# Patient Record
Sex: Female | Born: 1945
Health system: Southern US, Community
[De-identification: ages and names within clinical notes are randomized; demographics above are authoritative.]

## PROBLEM LIST (undated history)

## (undated) DIAGNOSIS — Z9989 Dependence on other enabling machines and devices: Secondary | ICD-10-CM

## (undated) DIAGNOSIS — K219 Gastro-esophageal reflux disease without esophagitis: Secondary | ICD-10-CM

## (undated) DIAGNOSIS — E119 Type 2 diabetes mellitus without complications: Secondary | ICD-10-CM

## (undated) DIAGNOSIS — D069 Carcinoma in situ of cervix, unspecified: Secondary | ICD-10-CM

## (undated) DIAGNOSIS — Z789 Other specified health status: Secondary | ICD-10-CM

## (undated) DIAGNOSIS — F319 Bipolar disorder, unspecified: Secondary | ICD-10-CM

## (undated) DIAGNOSIS — M1990 Unspecified osteoarthritis, unspecified site: Secondary | ICD-10-CM

## (undated) DIAGNOSIS — I10 Essential (primary) hypertension: Secondary | ICD-10-CM

## (undated) DIAGNOSIS — Z973 Presence of spectacles and contact lenses: Secondary | ICD-10-CM

## (undated) DIAGNOSIS — G4733 Obstructive sleep apnea (adult) (pediatric): Secondary | ICD-10-CM

## (undated) DIAGNOSIS — E785 Hyperlipidemia, unspecified: Secondary | ICD-10-CM

## (undated) HISTORY — PX: SUPRACERVICAL ABDOMINAL HYSTERECTOMY: SHX5393

## (undated) HISTORY — DX: Essential (primary) hypertension: I10

## (undated) HISTORY — PX: TUBAL LIGATION: SHX77

---

## 2002-08-14 ENCOUNTER — Inpatient Hospital Stay (HOSPITAL_COMMUNITY): Admission: EM | Admit: 2002-08-14 | Discharge: 2002-08-20 | Payer: Self-pay | Admitting: Psychiatry

## 2007-07-05 ENCOUNTER — Inpatient Hospital Stay (HOSPITAL_COMMUNITY): Admission: AD | Admit: 2007-07-05 | Discharge: 2007-07-08 | Payer: Self-pay | Admitting: Psychiatry

## 2007-07-05 ENCOUNTER — Ambulatory Visit: Payer: Self-pay | Admitting: Psychiatry

## 2011-01-15 NOTE — H&P (Signed)
[name], [name]                  ACCOUNT NO.:  [id number]   MEDICAL RECORD NO.:  [id number]          PATIENT TYPE:  IPS   LOCATION:  0406                          FACILITY:  [facility]   PHYSICIAN:  Anselm Jungling, MD  DATE OF BIRTH:  11-Jul-1946   DATE OF ADMISSION:  07/05/2007  DATE OF DISCHARGE:                       PSYCHIATRIC ADMISSION ASSESSMENT   IDENTIFYING INFORMATION:  This is a 65 year old divorced white female  who presented to the ED at [facility] reporting that she was stressed and  not sleeping for the past week.  The commitment papers indicate that she  was quite manic, she had been destructive to her home and could not  function.  Interestingly enough, her review of records shows that she  was started on prednisone 20 mg daily on October 24 and she has not  slept since then.  Today she can not tell us why the prednisone was  prescribed, but we will be stopping it.  On admission, the patient's  daughter stated that she felt that her mother had gotten distressed at a  birthday party where her mother-in-law started inferring that the  patient's son need not have married this daughter.   PAST PSYCHIATRIC HISTORY:  Ms. Melissa [name] was last with [place] in February 2003.  According to that discharge summary she had had one other remote  hospitalization, dates were unknown, and according to this admission she  has also been to [facility], but again there is no  date.  She is followed at [facility] by Dr. Cheree Ditto.   SOCIAL HISTORY:  She went to the 9th grade.  She has been married 4  times. She has 3 children.  Her oldest son Melissa [name], age 42, also has  bipolar.  She has another son 67 and a daughter 40.  The patient is not  employed and she does receive disability for her nerves.  Alcohol and  drug history:  Her father and brother both are alcoholics.  Primary care  [name]:  She is followed by Dr. Harrison Mons.  She is known to have diabetes,  hypertension and  hypercholesteremia.   MEDICATIONS:  She is currently prescribed:  1. Prozac 20 mg p.o. daily.  2. Metformin ER 500 mg p.o. daily.  3. Aspirin 325 mg p.o. daily.  4. Hydrochlorothiazide 25 mg p.o. daily.  5. Niacin ER 500 mg p.o. daily.  6. Lipitor 10 mg p.o. daily.  7. She is prescribed prednisone 20, but we are stopping that.   DRUG ALLERGIES:  SULFA.   POSITIVE PHYSICAL FINDINGS:  She was medically cleared in the ED at  [facility].  Her UDS was negative.  She had no alcohol on board.  She had  no other remarkable physical findings.  She was evaluated for atypical  chest pain and had no cardiac findings.  VITAL SIGNS ON ADMISSION TO OUR UNIT:  Show her pulse was 99,  respirations 16, blood pressure 155/89.  MENTAL STATUS EXAM:  She is quite drowsy.  She was given some Ativan on  admission and she is sleeping, she was awakened for the  exam.  She is  casually groomed and dressed.  She appears to be adequately nourished.  Her speech is decreased, although on admission she was tangential and  hyper.  Her mood at the moment is calm, her affect is sleepy.  She keeps  nodding off.  Thought processes:  Were not able to effectively evaluate  due to her being drowsy, but on admission her thought processes were  tangential and she had flight of ideas.  Judgment and insight are poor.  Concentration and memory were decreased and intelligence is average.  She denies being suicidal or homicidal.  She denies auditory or visual  hallucinations.   She was felt to be having an adjustment reaction with atypical chest  pain at [facility].  She does have a history for bipolar disorder.  Axis II:  Deferred, although with 4 husbands there is probably some  element of personality disorder.  Axis III:  Diabetes, hypertension and  hypercholesterolemia.  Axis IV:  Problems with primary support group.  Axis V:  20.   The plan is to admit for safety and stabilization.  We will adjust her  meds as indicated.   Toward that end, the prednisone was held, and  estimated length of stay is 3-4 days at the most.      [facility], [facility].      Anselm Jungling, MD  Electronically Signed    MD/MEDQ  D:  07/05/2007  T:  07/06/2007  Job:  [id number]
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
## 2011-01-18 NOTE — Discharge Summary (Signed)
NAMERONNESHA, MESTER                  ACCOUNT NO.:  [id number]   MEDICAL RECORD NO.:  [id number]          PATIENT TYPE:  IPS   LOCATION:  0406                          FACILITY:  [facility]   PHYSICIAN:  Anselm Jungling, MD  DATE OF BIRTH:  [birth year]   DATE OF ADMISSION:  07/05/2007  DATE OF DISCHARGE:  07/08/2007                               DISCHARGE SUMMARY   IDENTIFYING DATA AND REASON FOR ADMISSION:  This was an inpatient  psychiatric admission for Melissa [name], a 65 year old female who was admitted  at the to our inpatient facility after presenting at the emergency  department at [facility], stressed and not sleeping for a week.  That commitment papers indicated behavior and thinking consistent with a  manic episode.  She had been destructive to her home and was behaving  bizarrely.  Please refer to the admission note for further details  pertaining to the symptoms, circumstances and history that led to her  hospitalization.  She was given an initial Axis I diagnosis of psychosis  NOS.   MEDICAL AND LABORATORY:  The patient was medically and physically  assessed at [facility] emergency department, and then by our physician's  assistant upon arrival.  She came to [place] with a history of hypertension,  and diabetes mellitus.  She was continued on a regimen of  hydrochlorothiazide, Lipitor, Glucophage, and aspirin.  There were no  significant medical issues during this stay.   HOSPITAL COURSE:  The patient was admitted to the adult inpatient  psychiatric service.  She presented as a well-nourished, well-developed  woman who was generally pleasant, cheerful, inappropriately so, with  flight of ideas and tangential thinking.  She was generally cooperative.   She was treated with a regimen of Prozac 20 mg daily which she had been  taking previously.  Her physician, Dr. Cheree Ditto, was contacted by the  undersigned.  He indicated that she had severe episodes of psychosis in  the past.  He  suggested that we start her on Trilafon 2 mg q.h.s.  This  was initiated, and well tolerated.   The patient gave [place] permission to contact her adult children, and the  case manager did so.  The patient recompensated fairly rapidly, and she  agreed to continue taking Trilafon.  She liked the fact that it allowed  her to sleep better.  She agreed to the following aftercare plan.   AFTERCARE:  The patient was to follow up with an appointment at [facility] on July 09, 2007.   DISCHARGE MEDICATIONS:  Prozac 20 mg daily, Trilafon 2 mg q.h.s.,  hydrochlorothiazide 25 mg daily, niacin 1000 mg q.h.s., Lipitor 10 mg  q.p.m., Glucophage 500 mg daily, and aspirin 325 mg daily.  The patient  was instructed to follow-up with Dr. Harrison Mons, her medical physician, and  Dr. Cheree Ditto as scheduled.   DISCHARGE DIAGNOSES:  AXIS I: Bipolar disorder, most recently manic with  psychotic features, resolving.  AXIS II: Deferred.  AXIS III: History of hypertension, diabetes mellitus.  AXIS IV: Stressors severe.  AXIS V: GAF on discharge 55.  Anselm Jungling, MD  Electronically Signed     SPB/MEDQ  D:  07/10/2007  T:  07/12/2007  Job:  [id number]

## 2011-06-11 LAB — TSH: TSH: 1.761

## 2011-10-28 DIAGNOSIS — E119 Type 2 diabetes mellitus without complications: Secondary | ICD-10-CM | POA: Diagnosis not present

## 2011-10-28 DIAGNOSIS — E7800 Pure hypercholesterolemia, unspecified: Secondary | ICD-10-CM | POA: Diagnosis not present

## 2011-10-28 DIAGNOSIS — K219 Gastro-esophageal reflux disease without esophagitis: Secondary | ICD-10-CM | POA: Diagnosis not present

## 2011-10-28 DIAGNOSIS — I10 Essential (primary) hypertension: Secondary | ICD-10-CM | POA: Diagnosis not present

## 2011-12-19 DIAGNOSIS — H2589 Other age-related cataract: Secondary | ICD-10-CM | POA: Diagnosis not present

## 2011-12-19 DIAGNOSIS — E119 Type 2 diabetes mellitus without complications: Secondary | ICD-10-CM | POA: Diagnosis not present

## 2012-02-03 DIAGNOSIS — K219 Gastro-esophageal reflux disease without esophagitis: Secondary | ICD-10-CM | POA: Diagnosis not present

## 2012-02-03 DIAGNOSIS — I10 Essential (primary) hypertension: Secondary | ICD-10-CM | POA: Diagnosis not present

## 2012-02-03 DIAGNOSIS — E119 Type 2 diabetes mellitus without complications: Secondary | ICD-10-CM | POA: Diagnosis not present

## 2012-02-03 DIAGNOSIS — E7800 Pure hypercholesterolemia, unspecified: Secondary | ICD-10-CM | POA: Diagnosis not present

## 2012-02-10 DIAGNOSIS — I10 Essential (primary) hypertension: Secondary | ICD-10-CM | POA: Diagnosis not present

## 2012-03-31 DIAGNOSIS — F3189 Other bipolar disorder: Secondary | ICD-10-CM | POA: Diagnosis not present

## 2012-05-06 DIAGNOSIS — Z1231 Encounter for screening mammogram for malignant neoplasm of breast: Secondary | ICD-10-CM | POA: Diagnosis not present

## 2012-05-12 DIAGNOSIS — E7800 Pure hypercholesterolemia, unspecified: Secondary | ICD-10-CM | POA: Diagnosis not present

## 2012-05-12 DIAGNOSIS — R197 Diarrhea, unspecified: Secondary | ICD-10-CM | POA: Diagnosis not present

## 2012-05-12 DIAGNOSIS — E785 Hyperlipidemia, unspecified: Secondary | ICD-10-CM | POA: Diagnosis not present

## 2012-05-12 DIAGNOSIS — J40 Bronchitis, not specified as acute or chronic: Secondary | ICD-10-CM | POA: Diagnosis not present

## 2012-05-12 DIAGNOSIS — E119 Type 2 diabetes mellitus without complications: Secondary | ICD-10-CM | POA: Diagnosis not present

## 2012-05-28 DIAGNOSIS — I10 Essential (primary) hypertension: Secondary | ICD-10-CM | POA: Diagnosis not present

## 2012-05-28 DIAGNOSIS — Z23 Encounter for immunization: Secondary | ICD-10-CM | POA: Diagnosis not present

## 2012-05-28 DIAGNOSIS — E119 Type 2 diabetes mellitus without complications: Secondary | ICD-10-CM | POA: Diagnosis not present

## 2012-06-24 DIAGNOSIS — R0602 Shortness of breath: Secondary | ICD-10-CM | POA: Diagnosis not present

## 2012-06-24 DIAGNOSIS — K219 Gastro-esophageal reflux disease without esophagitis: Secondary | ICD-10-CM | POA: Diagnosis not present

## 2012-07-01 DIAGNOSIS — J309 Allergic rhinitis, unspecified: Secondary | ICD-10-CM | POA: Diagnosis not present

## 2012-07-01 DIAGNOSIS — I10 Essential (primary) hypertension: Secondary | ICD-10-CM | POA: Diagnosis not present

## 2012-07-01 DIAGNOSIS — E119 Type 2 diabetes mellitus without complications: Secondary | ICD-10-CM | POA: Diagnosis not present

## 2012-08-07 DIAGNOSIS — F3189 Other bipolar disorder: Secondary | ICD-10-CM | POA: Diagnosis not present

## 2012-08-18 DIAGNOSIS — E785 Hyperlipidemia, unspecified: Secondary | ICD-10-CM | POA: Diagnosis not present

## 2012-08-18 DIAGNOSIS — E119 Type 2 diabetes mellitus without complications: Secondary | ICD-10-CM | POA: Diagnosis not present

## 2012-08-18 DIAGNOSIS — I10 Essential (primary) hypertension: Secondary | ICD-10-CM | POA: Diagnosis not present

## 2012-08-18 DIAGNOSIS — R197 Diarrhea, unspecified: Secondary | ICD-10-CM | POA: Diagnosis not present

## 2012-09-24 DIAGNOSIS — M542 Cervicalgia: Secondary | ICD-10-CM | POA: Diagnosis not present

## 2012-09-24 DIAGNOSIS — K219 Gastro-esophageal reflux disease without esophagitis: Secondary | ICD-10-CM | POA: Diagnosis not present

## 2012-09-29 DIAGNOSIS — J00 Acute nasopharyngitis [common cold]: Secondary | ICD-10-CM | POA: Diagnosis not present

## 2012-10-02 DIAGNOSIS — E119 Type 2 diabetes mellitus without complications: Secondary | ICD-10-CM | POA: Diagnosis not present

## 2012-10-02 DIAGNOSIS — F329 Major depressive disorder, single episode, unspecified: Secondary | ICD-10-CM | POA: Diagnosis not present

## 2012-10-02 DIAGNOSIS — I10 Essential (primary) hypertension: Secondary | ICD-10-CM | POA: Diagnosis not present

## 2012-10-06 DIAGNOSIS — I10 Essential (primary) hypertension: Secondary | ICD-10-CM | POA: Diagnosis not present

## 2012-10-06 DIAGNOSIS — J209 Acute bronchitis, unspecified: Secondary | ICD-10-CM | POA: Diagnosis not present

## 2012-10-06 DIAGNOSIS — J00 Acute nasopharyngitis [common cold]: Secondary | ICD-10-CM | POA: Diagnosis not present

## 2012-10-06 DIAGNOSIS — F329 Major depressive disorder, single episode, unspecified: Secondary | ICD-10-CM | POA: Diagnosis not present

## 2012-10-06 DIAGNOSIS — E119 Type 2 diabetes mellitus without complications: Secondary | ICD-10-CM | POA: Diagnosis not present

## 2012-10-08 DIAGNOSIS — F329 Major depressive disorder, single episode, unspecified: Secondary | ICD-10-CM | POA: Diagnosis not present

## 2012-10-08 DIAGNOSIS — E119 Type 2 diabetes mellitus without complications: Secondary | ICD-10-CM | POA: Diagnosis not present

## 2012-10-08 DIAGNOSIS — I10 Essential (primary) hypertension: Secondary | ICD-10-CM | POA: Diagnosis not present

## 2012-10-13 DIAGNOSIS — F329 Major depressive disorder, single episode, unspecified: Secondary | ICD-10-CM | POA: Diagnosis not present

## 2012-10-13 DIAGNOSIS — I10 Essential (primary) hypertension: Secondary | ICD-10-CM | POA: Diagnosis not present

## 2012-10-13 DIAGNOSIS — E119 Type 2 diabetes mellitus without complications: Secondary | ICD-10-CM | POA: Diagnosis not present

## 2012-10-20 DIAGNOSIS — E119 Type 2 diabetes mellitus without complications: Secondary | ICD-10-CM | POA: Diagnosis not present

## 2012-10-20 DIAGNOSIS — F329 Major depressive disorder, single episode, unspecified: Secondary | ICD-10-CM | POA: Diagnosis not present

## 2012-10-20 DIAGNOSIS — I10 Essential (primary) hypertension: Secondary | ICD-10-CM | POA: Diagnosis not present

## 2012-10-22 DIAGNOSIS — E119 Type 2 diabetes mellitus without complications: Secondary | ICD-10-CM | POA: Diagnosis not present

## 2012-10-22 DIAGNOSIS — I10 Essential (primary) hypertension: Secondary | ICD-10-CM | POA: Diagnosis not present

## 2012-10-22 DIAGNOSIS — F329 Major depressive disorder, single episode, unspecified: Secondary | ICD-10-CM | POA: Diagnosis not present

## 2012-10-28 DIAGNOSIS — E119 Type 2 diabetes mellitus without complications: Secondary | ICD-10-CM | POA: Diagnosis not present

## 2012-10-28 DIAGNOSIS — I10 Essential (primary) hypertension: Secondary | ICD-10-CM | POA: Diagnosis not present

## 2012-10-28 DIAGNOSIS — F329 Major depressive disorder, single episode, unspecified: Secondary | ICD-10-CM | POA: Diagnosis not present

## 2012-10-30 DIAGNOSIS — F329 Major depressive disorder, single episode, unspecified: Secondary | ICD-10-CM | POA: Diagnosis not present

## 2012-10-30 DIAGNOSIS — E119 Type 2 diabetes mellitus without complications: Secondary | ICD-10-CM | POA: Diagnosis not present

## 2012-10-30 DIAGNOSIS — I10 Essential (primary) hypertension: Secondary | ICD-10-CM | POA: Diagnosis not present

## 2012-11-04 DIAGNOSIS — I10 Essential (primary) hypertension: Secondary | ICD-10-CM | POA: Diagnosis not present

## 2012-11-04 DIAGNOSIS — F329 Major depressive disorder, single episode, unspecified: Secondary | ICD-10-CM | POA: Diagnosis not present

## 2012-11-04 DIAGNOSIS — E119 Type 2 diabetes mellitus without complications: Secondary | ICD-10-CM | POA: Diagnosis not present

## 2012-11-11 DIAGNOSIS — M542 Cervicalgia: Secondary | ICD-10-CM | POA: Diagnosis not present

## 2012-11-11 DIAGNOSIS — E7800 Pure hypercholesterolemia, unspecified: Secondary | ICD-10-CM | POA: Diagnosis not present

## 2012-11-11 DIAGNOSIS — E119 Type 2 diabetes mellitus without complications: Secondary | ICD-10-CM | POA: Diagnosis not present

## 2012-11-11 DIAGNOSIS — F329 Major depressive disorder, single episode, unspecified: Secondary | ICD-10-CM | POA: Diagnosis not present

## 2012-11-11 DIAGNOSIS — I10 Essential (primary) hypertension: Secondary | ICD-10-CM | POA: Diagnosis not present

## 2012-11-12 DIAGNOSIS — M47817 Spondylosis without myelopathy or radiculopathy, lumbosacral region: Secondary | ICD-10-CM | POA: Diagnosis not present

## 2012-11-12 DIAGNOSIS — M5030 Other cervical disc degeneration, unspecified cervical region: Secondary | ICD-10-CM | POA: Diagnosis not present

## 2012-11-18 DIAGNOSIS — E119 Type 2 diabetes mellitus without complications: Secondary | ICD-10-CM | POA: Diagnosis not present

## 2012-11-18 DIAGNOSIS — F329 Major depressive disorder, single episode, unspecified: Secondary | ICD-10-CM | POA: Diagnosis not present

## 2012-11-18 DIAGNOSIS — I10 Essential (primary) hypertension: Secondary | ICD-10-CM | POA: Diagnosis not present

## 2012-11-18 DIAGNOSIS — M47812 Spondylosis without myelopathy or radiculopathy, cervical region: Secondary | ICD-10-CM | POA: Diagnosis not present

## 2012-11-25 DIAGNOSIS — I10 Essential (primary) hypertension: Secondary | ICD-10-CM | POA: Diagnosis not present

## 2012-11-25 DIAGNOSIS — E119 Type 2 diabetes mellitus without complications: Secondary | ICD-10-CM | POA: Diagnosis not present

## 2012-11-25 DIAGNOSIS — F329 Major depressive disorder, single episode, unspecified: Secondary | ICD-10-CM | POA: Diagnosis not present

## 2013-01-05 DIAGNOSIS — F3189 Other bipolar disorder: Secondary | ICD-10-CM | POA: Diagnosis not present

## 2013-01-08 DIAGNOSIS — E119 Type 2 diabetes mellitus without complications: Secondary | ICD-10-CM | POA: Diagnosis not present

## 2013-01-08 DIAGNOSIS — K219 Gastro-esophageal reflux disease without esophagitis: Secondary | ICD-10-CM | POA: Diagnosis not present

## 2013-01-08 DIAGNOSIS — I10 Essential (primary) hypertension: Secondary | ICD-10-CM | POA: Diagnosis not present

## 2013-01-08 DIAGNOSIS — E7800 Pure hypercholesterolemia, unspecified: Secondary | ICD-10-CM | POA: Diagnosis not present

## 2013-02-02 DIAGNOSIS — F3189 Other bipolar disorder: Secondary | ICD-10-CM | POA: Diagnosis not present

## 2013-03-16 DIAGNOSIS — E669 Obesity, unspecified: Secondary | ICD-10-CM | POA: Diagnosis not present

## 2013-03-16 DIAGNOSIS — E119 Type 2 diabetes mellitus without complications: Secondary | ICD-10-CM | POA: Diagnosis not present

## 2013-03-16 DIAGNOSIS — E785 Hyperlipidemia, unspecified: Secondary | ICD-10-CM | POA: Diagnosis not present

## 2013-03-16 DIAGNOSIS — I10 Essential (primary) hypertension: Secondary | ICD-10-CM | POA: Diagnosis not present

## 2013-05-04 DIAGNOSIS — F3189 Other bipolar disorder: Secondary | ICD-10-CM | POA: Diagnosis not present

## 2013-05-05 DIAGNOSIS — Z23 Encounter for immunization: Secondary | ICD-10-CM | POA: Diagnosis not present

## 2013-05-05 DIAGNOSIS — R079 Chest pain, unspecified: Secondary | ICD-10-CM | POA: Diagnosis not present

## 2013-05-05 DIAGNOSIS — M940 Chondrocostal junction syndrome [Tietze]: Secondary | ICD-10-CM | POA: Diagnosis not present

## 2013-05-14 DIAGNOSIS — Z1231 Encounter for screening mammogram for malignant neoplasm of breast: Secondary | ICD-10-CM | POA: Diagnosis not present

## 2013-05-25 DIAGNOSIS — E7800 Pure hypercholesterolemia, unspecified: Secondary | ICD-10-CM | POA: Diagnosis not present

## 2013-05-25 DIAGNOSIS — R5381 Other malaise: Secondary | ICD-10-CM | POA: Diagnosis not present

## 2013-05-25 DIAGNOSIS — I10 Essential (primary) hypertension: Secondary | ICD-10-CM | POA: Diagnosis not present

## 2013-05-25 DIAGNOSIS — K219 Gastro-esophageal reflux disease without esophagitis: Secondary | ICD-10-CM | POA: Diagnosis not present

## 2013-05-25 DIAGNOSIS — Z124 Encounter for screening for malignant neoplasm of cervix: Secondary | ICD-10-CM | POA: Diagnosis not present

## 2013-05-25 DIAGNOSIS — Z01419 Encounter for gynecological examination (general) (routine) without abnormal findings: Secondary | ICD-10-CM | POA: Diagnosis not present

## 2013-05-25 DIAGNOSIS — E559 Vitamin D deficiency, unspecified: Secondary | ICD-10-CM | POA: Diagnosis not present

## 2013-05-25 DIAGNOSIS — Z1212 Encounter for screening for malignant neoplasm of rectum: Secondary | ICD-10-CM | POA: Diagnosis not present

## 2013-05-25 DIAGNOSIS — R5383 Other fatigue: Secondary | ICD-10-CM | POA: Diagnosis not present

## 2013-05-25 DIAGNOSIS — E119 Type 2 diabetes mellitus without complications: Secondary | ICD-10-CM | POA: Diagnosis not present

## 2013-05-25 DIAGNOSIS — E669 Obesity, unspecified: Secondary | ICD-10-CM | POA: Diagnosis not present

## 2013-05-25 DIAGNOSIS — R87619 Unspecified abnormal cytological findings in specimens from cervix uteri: Secondary | ICD-10-CM | POA: Diagnosis not present

## 2013-06-04 DIAGNOSIS — F3189 Other bipolar disorder: Secondary | ICD-10-CM | POA: Diagnosis not present

## 2013-06-08 DIAGNOSIS — I10 Essential (primary) hypertension: Secondary | ICD-10-CM | POA: Diagnosis not present

## 2013-06-08 DIAGNOSIS — E7800 Pure hypercholesterolemia, unspecified: Secondary | ICD-10-CM | POA: Diagnosis not present

## 2013-06-08 DIAGNOSIS — E119 Type 2 diabetes mellitus without complications: Secondary | ICD-10-CM | POA: Diagnosis not present

## 2013-06-10 DIAGNOSIS — R109 Unspecified abdominal pain: Secondary | ICD-10-CM | POA: Diagnosis not present

## 2013-06-10 DIAGNOSIS — N200 Calculus of kidney: Secondary | ICD-10-CM | POA: Diagnosis not present

## 2013-06-10 DIAGNOSIS — M545 Low back pain, unspecified: Secondary | ICD-10-CM | POA: Diagnosis not present

## 2013-06-10 DIAGNOSIS — R52 Pain, unspecified: Secondary | ICD-10-CM | POA: Diagnosis not present

## 2013-06-10 DIAGNOSIS — R197 Diarrhea, unspecified: Secondary | ICD-10-CM | POA: Diagnosis not present

## 2013-06-15 DIAGNOSIS — N390 Urinary tract infection, site not specified: Secondary | ICD-10-CM | POA: Diagnosis not present

## 2013-06-15 DIAGNOSIS — M549 Dorsalgia, unspecified: Secondary | ICD-10-CM | POA: Diagnosis not present

## 2013-06-15 DIAGNOSIS — N200 Calculus of kidney: Secondary | ICD-10-CM | POA: Diagnosis not present

## 2013-06-17 DIAGNOSIS — D069 Carcinoma in situ of cervix, unspecified: Secondary | ICD-10-CM | POA: Diagnosis not present

## 2013-06-17 DIAGNOSIS — R87619 Unspecified abnormal cytological findings in specimens from cervix uteri: Secondary | ICD-10-CM | POA: Diagnosis not present

## 2013-06-21 DIAGNOSIS — M461 Sacroiliitis, not elsewhere classified: Secondary | ICD-10-CM | POA: Diagnosis not present

## 2013-06-21 DIAGNOSIS — M549 Dorsalgia, unspecified: Secondary | ICD-10-CM | POA: Diagnosis not present

## 2013-06-28 DIAGNOSIS — I10 Essential (primary) hypertension: Secondary | ICD-10-CM | POA: Diagnosis not present

## 2013-06-28 DIAGNOSIS — M47817 Spondylosis without myelopathy or radiculopathy, lumbosacral region: Secondary | ICD-10-CM | POA: Diagnosis not present

## 2013-06-28 DIAGNOSIS — R350 Frequency of micturition: Secondary | ICD-10-CM | POA: Diagnosis not present

## 2013-06-28 DIAGNOSIS — M549 Dorsalgia, unspecified: Secondary | ICD-10-CM | POA: Diagnosis not present

## 2013-06-30 DIAGNOSIS — R5381 Other malaise: Secondary | ICD-10-CM | POA: Diagnosis not present

## 2013-07-05 DIAGNOSIS — M545 Low back pain, unspecified: Secondary | ICD-10-CM | POA: Diagnosis not present

## 2013-07-05 DIAGNOSIS — G4700 Insomnia, unspecified: Secondary | ICD-10-CM | POA: Diagnosis not present

## 2013-07-05 DIAGNOSIS — Z6834 Body mass index (BMI) 34.0-34.9, adult: Secondary | ICD-10-CM | POA: Diagnosis not present

## 2013-07-12 DIAGNOSIS — M545 Low back pain, unspecified: Secondary | ICD-10-CM | POA: Diagnosis not present

## 2013-07-12 DIAGNOSIS — Z1331 Encounter for screening for depression: Secondary | ICD-10-CM | POA: Diagnosis not present

## 2013-07-12 DIAGNOSIS — Z9181 History of falling: Secondary | ICD-10-CM | POA: Diagnosis not present

## 2013-07-12 DIAGNOSIS — M899 Disorder of bone, unspecified: Secondary | ICD-10-CM | POA: Diagnosis not present

## 2013-07-12 DIAGNOSIS — Z13820 Encounter for screening for osteoporosis: Secondary | ICD-10-CM | POA: Diagnosis not present

## 2013-07-12 DIAGNOSIS — Z6834 Body mass index (BMI) 34.0-34.9, adult: Secondary | ICD-10-CM | POA: Diagnosis not present

## 2013-08-03 DIAGNOSIS — D069 Carcinoma in situ of cervix, unspecified: Secondary | ICD-10-CM | POA: Diagnosis not present

## 2013-08-03 DIAGNOSIS — R87619 Unspecified abnormal cytological findings in specimens from cervix uteri: Secondary | ICD-10-CM | POA: Diagnosis not present

## 2013-08-05 DIAGNOSIS — R0902 Hypoxemia: Secondary | ICD-10-CM | POA: Diagnosis not present

## 2013-08-06 DIAGNOSIS — F3189 Other bipolar disorder: Secondary | ICD-10-CM | POA: Diagnosis not present

## 2013-08-13 DIAGNOSIS — D069 Carcinoma in situ of cervix, unspecified: Secondary | ICD-10-CM | POA: Diagnosis not present

## 2013-08-24 DIAGNOSIS — I10 Essential (primary) hypertension: Secondary | ICD-10-CM | POA: Diagnosis not present

## 2013-08-24 DIAGNOSIS — E119 Type 2 diabetes mellitus without complications: Secondary | ICD-10-CM | POA: Diagnosis not present

## 2013-08-24 DIAGNOSIS — G4733 Obstructive sleep apnea (adult) (pediatric): Secondary | ICD-10-CM | POA: Diagnosis not present

## 2013-08-24 DIAGNOSIS — R635 Abnormal weight gain: Secondary | ICD-10-CM | POA: Diagnosis not present

## 2013-08-24 DIAGNOSIS — E785 Hyperlipidemia, unspecified: Secondary | ICD-10-CM | POA: Diagnosis not present

## 2013-09-09 ENCOUNTER — Encounter: Payer: Self-pay | Admitting: Gynecologic Oncology

## 2013-09-09 ENCOUNTER — Encounter (INDEPENDENT_AMBULATORY_CARE_PROVIDER_SITE_OTHER): Payer: Self-pay

## 2013-09-09 ENCOUNTER — Ambulatory Visit: Payer: Medicare Other | Attending: Gynecologic Oncology | Admitting: Gynecologic Oncology

## 2013-09-09 VITALS — BP 133/81 | HR 111 | Temp 98.4°F | Resp 20 | Ht 62.99 in | Wt 202.9 lb

## 2013-09-09 DIAGNOSIS — D069 Carcinoma in situ of cervix, unspecified: Secondary | ICD-10-CM | POA: Diagnosis not present

## 2013-09-09 DIAGNOSIS — I10 Essential (primary) hypertension: Secondary | ICD-10-CM | POA: Diagnosis not present

## 2013-09-09 DIAGNOSIS — M79609 Pain in unspecified limb: Secondary | ICD-10-CM | POA: Insufficient documentation

## 2013-09-09 DIAGNOSIS — M545 Low back pain, unspecified: Secondary | ICD-10-CM | POA: Diagnosis not present

## 2013-09-09 DIAGNOSIS — Z87891 Personal history of nicotine dependence: Secondary | ICD-10-CM | POA: Insufficient documentation

## 2013-09-09 DIAGNOSIS — Z90711 Acquired absence of uterus with remaining cervical stump: Secondary | ICD-10-CM | POA: Insufficient documentation

## 2013-09-09 DIAGNOSIS — Z7982 Long term (current) use of aspirin: Secondary | ICD-10-CM | POA: Insufficient documentation

## 2013-09-09 DIAGNOSIS — Z79899 Other long term (current) drug therapy: Secondary | ICD-10-CM | POA: Insufficient documentation

## 2013-09-09 DIAGNOSIS — E119 Type 2 diabetes mellitus without complications: Secondary | ICD-10-CM | POA: Diagnosis not present

## 2013-09-09 NOTE — Progress Notes (Signed)
Consult Note: Gyn-Onc  Consult was requested by Dr.Kullish-Shaw for the evaluation of Melissa [name] 68 y.o. female  CC:  Chief Complaint  Patient presents with  . CIN III    New pt    Assessment/Plan:  Ms. Melissa [name]  is a 68 y.o.  year old status post supracervical hysterectomy who presented with AGUS Pap and positive HPV. Colposcopy is notable for an ECC demonstrating CIN-3/CIS with suspicion for microinvasion.  On 09/23/2013, Under anesthesia, a LEEP or cold knife will be collected to aid in the decision management restarting diagnosis and treatment    HPI: Melissa [name] is a 68 year old gravida 4 para 3 last moments appeared in her 42s a history remarkable for a supracervical hysterectomy because of a uterine mass. The patient denies any prior Pap tests for the last 10 years however Pap test prior to that were within normal limits. A Pap test was collected on 05/25/2013 and returned with atypical glandular cells of undetermined significance. Colposcopy was collected on 08/03/2013. Endocervical curettings were notable for strips of squamous epithelium with CIN-3 CIS favor microinvasion and endocervical glandular component was not identified.  Ms. Melissa [name] denies vaginal bleeding weight loss hematuria hematochezia. She reports chronic low back and leg pain   Current Meds:  Outpatient Encounter Prescriptions as of 09/09/2013  Medication Sig  . aspirin 81 MG tablet Take 81 mg by mouth daily.  Marland Kitchen atorvastatin (LIPITOR) 40 MG tablet Take 40 mg by mouth daily.  . Calcium Carbonate-Vitamin D (CALCIUM-VITAMIN D) 600-200 MG-UNIT CAPS Take by mouth 2 (two) times daily.  Marland Kitchen FLUoxetine (PROZAC) 20 MG capsule Take 20 mg by mouth daily.  Marland Kitchen glimepiride (AMARYL) 2 MG tablet Take 4 mg by mouth daily with breakfast.  . losartan-hydrochlorothiazide (HYZAAR) 50-12.5 MG per tablet Take 1 tablet by mouth daily.  . metFORMIN (GLUCOPHAGE) 500 MG tablet Take 500 mg by mouth 2 (two) times daily with a meal.  .  Multiple Vitamins-Minerals (MULTIVITAMIN PO) Take by mouth daily.  . risperiDONE (RISPERDAL) 2 MG tablet Take 2 mg by mouth at bedtime.  . traZODone (DESYREL) 50 MG tablet Take 50 mg by mouth at bedtime.  Marland Kitchen VITAMIN D, ERGOCALCIFEROL, PO Take by mouth daily.    Allergy:  Allergies  Allergen Reactions  . Sulfa Antibiotics Hives    Social Hx:   History   Social History  . Marital Status: Divorced    Spouse Name: N/A    Number of Children: N/A  . Years of Education: N/A   Occupational History  . Not on file.   Social History Main Topics  . Smoking status: Former Research scientist (life sciences)  . Smokeless tobacco: Not on file  . Alcohol Use: No     Comment: in the past  . Drug Use: No  . Sexual Activity: Not on file     Comment: Quit 25 years ago   Other Topics Concern  . Not on file   Social History Narrative  . No narrative on file    Past Surgical Hx:  Past Surgical History  Procedure Laterality Date  . Abdominal hysterectomy      25 years ago, "tumor growing on it"  . Tubal ligation      Past Medical Hx:  Past Medical History  Diagnosis Date  . Diabetes mellitus without complication   . Hypertension     Past Gynecological History: G4P3 LNMP 40's supracervical hysterectomy   No LMP recorded.  Family Hx:  Family History  Problem Relation Age  of Onset  . Hypertension Mother   . Hypertension Father   . Kidney cancer Sister   . Cancer Brother     Review of Systems:  Constitutional  Feels well, Cardiovascular  No chest pain, shortness of breath, or edema  Pulmonary  No cough or wheeze.  Gastro Intestinal  No nausea, vomitting, or diarrhoea. No bright red blood per rectum, no abdominal pain, change in bowel movement, or constipation.  Genito Urinary  No frequency, urgency, dysuria,no vaginal bleeding. Musculo Skeletal  No myalgia, arthralgia, joint swelling or pain  Neurologic  No weakness, numbness, change in gait,  Psychology  No depression, anxiety, insomnia.    Vitals:  Blood pressure 133/81, pulse 111, temperature 98.4 F (36.9 C), temperature source Oral, resp. rate 20, height 5' 2.99" (1.6 m), weight 202 lb 14.4 oz (92.035 kg).  Physical Exam: WD in NAD Neck  Supple NROM, without any enlargements.  Lymph Node Survey No cervical supraclavicular or inguinal adenopathy Cardiovascular  Pulse normal rate, regularity and rhythm.  Lungs  Clear to auscultation bilateraly, without wheezes/crackles/rhonchi. Psychiatry  Alert and oriented to person, place, and time poor short term recall able to remember 2/3 objects.  Not able to recall primary point of discussion.  Unable to do simple arithmitic Abdomen  Normoactive bowel sounds, abdomen soft, non-tender and obese.  Back No CVA tenderness Genito Urinary  Vulva/vagina: Normal external female genitalia.     Bladder/urethra:  No lesions or masses  Vagina:hypoestrogenic, no blood in the vault.    Cervix: Normal appearing,flush with the vaginal vault inferiorly.  Adnexa: No palpable masses. Rectal  Fecal matter all around the anus. Extremities  No bilateral cyanosis, clubbing or edema.   Deng Kemler, MD, PhD 09/09/2013, 5:01 PM    

## 2013-09-09 NOTE — Patient Instructions (Addendum)
Plan for surgery at [place] attached to [facility] the morning of Jan 22.  You will receive a phone call from the pre-operative RN to discuss instructions.  Please call for any questions or concerns.  Loop Electrosurgical Excision Procedure Care After  Refer to this sheet in the next few weeks. These instructions provide you with information on caring for yourself after your procedure. Your caregiver may also give you more specific instructions. Your treatment has been planned according to current medical practices, but problems sometimes occur. Call your caregiver if you have any problems or questions after your procedure. HOME CARE INSTRUCTIONS   Do not use tampons, douche, or have sexual intercourse for 2 weeks or as directed by your caregiver.  Begin normal activities if you have no or minimal cramping or bleeding, unless directed otherwise by your caregiver.  Take your temperature if you feel sick. Write down your temperature on paper, and tell your caregiver if you have a fever.  Take all medicines as directed by your caregiver.  Keep all your follow-up appointments and Pap tests as directed by your caregiver. SEEK IMMEDIATE MEDICAL CARE IF:   You have bleeding that is heavier or longer than a normal menstrual cycle.  You have bleeding that is bright red.  You have blood clots.  You have a fever.  You have increasing cramps or pain not relieved by medicine.  You develop abdominal pain that does not seem to be related to the same area of earlier cramping and pain.  You are lightheaded, unusually weak, or faint.  You develop painful or bloody urination.  You develop a bad smelling vaginal discharge. MAKE SURE YOU:  Understand these instructions.  Will watch your condition.  Will get help right away if you are not doing well or get worse. Document Released: 05/02/2011 Document Revised: 11/11/2011 Document Reviewed: 05/02/2011 [facility]  Patient Information 2014 [place].

## 2013-09-14 DIAGNOSIS — E119 Type 2 diabetes mellitus without complications: Secondary | ICD-10-CM | POA: Diagnosis not present

## 2013-09-14 DIAGNOSIS — H2510 Age-related nuclear cataract, unspecified eye: Secondary | ICD-10-CM | POA: Diagnosis not present

## 2013-09-15 ENCOUNTER — Encounter (HOSPITAL_BASED_OUTPATIENT_CLINIC_OR_DEPARTMENT_OTHER): Payer: Self-pay | Admitting: *Deleted

## 2013-09-17 ENCOUNTER — Encounter (HOSPITAL_BASED_OUTPATIENT_CLINIC_OR_DEPARTMENT_OTHER): Payer: Self-pay | Admitting: *Deleted

## 2013-09-17 NOTE — Progress Notes (Signed)
SPOKE W/ DAUGHTER, PT IS POOR HISTORIAN .  NPO AFTER MN. ARRIVE AT 0630.  NEEDS ISTAT AND EKG. WILL TAKE PROZAC, PRILOSEC, AND LIPITOR AM DOS W/ SIPS OF WATER.  WILL BRING CPAP.

## 2013-09-22 ENCOUNTER — Telehealth: Payer: Self-pay | Admitting: *Deleted

## 2013-09-22 NOTE — Anesthesia Preprocedure Evaluation (Addendum)
Anesthesia Evaluation  Patient identified by MRN, date of birth, ID band Patient awake    Reviewed: Allergy & Precautions, H&P , NPO status , Patient's Chart, lab work & pertinent test results  Airway Mallampati: II TM Distance: >3 FB Neck ROM: Full    Dental  (+) Edentulous Lower and Edentulous Upper   Pulmonary sleep apnea and Continuous Positive Airway Pressure Ventilation , former smoker,  breath sounds clear to auscultation  Pulmonary exam normal       Cardiovascular hypertension, Pt. on medications Rhythm:Regular Rate:Normal     Neuro/Psych PSYCHIATRIC DISORDERS Depression Bipolar Disorder Major depression with psychotic features.negative neurological ROS     GI/Hepatic Neg liver ROS, GERD-  Medicated,  Endo/Other  diabetes, Type 2, Oral Hypoglycemic Agents and Insulin Dependent  Renal/GU negative Renal ROS  negative genitourinary   Musculoskeletal negative musculoskeletal ROS (+)   Abdominal   Peds  Hematology negative hematology ROS (+)   Anesthesia Other Findings   Reproductive/Obstetrics                         Anesthesia Physical Anesthesia Plan  ASA: III  Anesthesia Plan: General   Post-op Pain Management:    Induction: Intravenous  Airway Management Planned: LMA  Additional Equipment:   Intra-op Plan:   Post-operative Plan: Extubation in OR  Informed Consent: I have reviewed the patients History and Physical, chart, labs and discussed the procedure including the risks, benefits and alternatives for the proposed anesthesia with the patient or authorized representative who has indicated his/her understanding and acceptance.   Dental advisory given  Plan Discussed with: CRNA  Anesthesia Plan Comments:         Anesthesia Quick Evaluation

## 2013-09-22 NOTE — Telephone Encounter (Signed)
Call to pt, Daughter Vicente Males answered phone who advised she will ive message to pt. Requested Vicente Males give pt reminder to be NPO after midnight, we will call her with final pathology results. Nothing in vagina x 6 weeks, Pt to call with any concerns, we will call to follow up with her after surgery to check on her status.

## 2013-09-23 ENCOUNTER — Encounter (HOSPITAL_BASED_OUTPATIENT_CLINIC_OR_DEPARTMENT_OTHER): Payer: Self-pay

## 2013-09-23 ENCOUNTER — Ambulatory Visit (HOSPITAL_BASED_OUTPATIENT_CLINIC_OR_DEPARTMENT_OTHER): Payer: Medicare Other | Admitting: Anesthesiology

## 2013-09-23 ENCOUNTER — Encounter (HOSPITAL_BASED_OUTPATIENT_CLINIC_OR_DEPARTMENT_OTHER): Payer: Medicare Other | Admitting: Anesthesiology

## 2013-09-23 ENCOUNTER — Ambulatory Visit (HOSPITAL_BASED_OUTPATIENT_CLINIC_OR_DEPARTMENT_OTHER)
Admission: RE | Admit: 2013-09-23 | Discharge: 2013-09-23 | Disposition: A | Discharge status: Home / Self Care | Payer: Medicare Other | Source: Ambulatory Visit | Attending: Gynecologic Oncology | Admitting: Gynecologic Oncology

## 2013-09-23 ENCOUNTER — Encounter (HOSPITAL_BASED_OUTPATIENT_CLINIC_OR_DEPARTMENT_OTHER)
Admission: RE | Disposition: A | Discharge status: Home / Self Care | Payer: Self-pay | Source: Ambulatory Visit | Attending: Gynecologic Oncology

## 2013-09-23 DIAGNOSIS — C539 Malignant neoplasm of cervix uteri, unspecified: Secondary | ICD-10-CM | POA: Insufficient documentation

## 2013-09-23 DIAGNOSIS — D069 Carcinoma in situ of cervix, unspecified: Secondary | ICD-10-CM

## 2013-09-23 DIAGNOSIS — N879 Dysplasia of cervix uteri, unspecified: Secondary | ICD-10-CM | POA: Diagnosis not present

## 2013-09-23 DIAGNOSIS — Z7982 Long term (current) use of aspirin: Secondary | ICD-10-CM | POA: Insufficient documentation

## 2013-09-23 DIAGNOSIS — Z87891 Personal history of nicotine dependence: Secondary | ICD-10-CM | POA: Insufficient documentation

## 2013-09-23 DIAGNOSIS — G4730 Sleep apnea, unspecified: Secondary | ICD-10-CM | POA: Insufficient documentation

## 2013-09-23 DIAGNOSIS — K219 Gastro-esophageal reflux disease without esophagitis: Secondary | ICD-10-CM | POA: Insufficient documentation

## 2013-09-23 DIAGNOSIS — E119 Type 2 diabetes mellitus without complications: Secondary | ICD-10-CM | POA: Diagnosis not present

## 2013-09-23 DIAGNOSIS — Z79899 Other long term (current) drug therapy: Secondary | ICD-10-CM | POA: Diagnosis not present

## 2013-09-23 DIAGNOSIS — I10 Essential (primary) hypertension: Secondary | ICD-10-CM | POA: Insufficient documentation

## 2013-09-23 HISTORY — PX: BIOPSY: SHX5522

## 2013-09-23 HISTORY — DX: Carcinoma in situ of cervix, unspecified: D06.9

## 2013-09-23 HISTORY — DX: Unspecified osteoarthritis, unspecified site: M19.90

## 2013-09-23 HISTORY — DX: Other specified health status: Z78.9

## 2013-09-23 HISTORY — DX: Obstructive sleep apnea (adult) (pediatric): G47.33

## 2013-09-23 HISTORY — DX: Obstructive sleep apnea (adult) (pediatric): Z99.89

## 2013-09-23 HISTORY — DX: Gastro-esophageal reflux disease without esophagitis: K21.9

## 2013-09-23 HISTORY — DX: Bipolar disorder, unspecified: F31.9

## 2013-09-23 HISTORY — DX: Presence of spectacles and contact lenses: Z97.3

## 2013-09-23 HISTORY — DX: Hyperlipidemia, unspecified: E78.5

## 2013-09-23 HISTORY — DX: Type 2 diabetes mellitus without complications: E11.9

## 2013-09-23 LAB — POCT I-STAT 4, (NA,K, GLUC, HGB,HCT)
Glucose, Bld: 130 mg/dL — ABNORMAL HIGH (ref 70–99)
HCT: 40 % (ref 36.0–46.0)
Hemoglobin: 13.6 g/dL (ref 12.0–15.0)
Potassium: 3.6 mEq/L — ABNORMAL LOW (ref 3.7–5.3)
Sodium: 140 mEq/L (ref 137–147)

## 2013-09-23 LAB — GLUCOSE, CAPILLARY: GLUCOSE-CAPILLARY: 119 mg/dL — AB (ref 70–99)

## 2013-09-23 SURGERY — BIOPSY
Anesthesia: General | Site: Cervix

## 2013-09-23 MED ORDER — LACTATED RINGERS IV SOLN
INTRAVENOUS | Status: DC
  Administered 2013-09-23: 08:00:00 via INTRAVENOUS
  Filled 2013-09-23: qty 1000

## 2013-09-23 MED ORDER — MIDAZOLAM HCL 2 MG/2ML IJ SOLN
INTRAMUSCULAR | Status: AC
  Filled 2013-09-23: qty 2

## 2013-09-23 MED ORDER — FENTANYL CITRATE 0.05 MG/ML IJ SOLN
INTRAMUSCULAR | Status: AC
  Filled 2013-09-23: qty 4

## 2013-09-23 MED ORDER — HYDROMORPHONE HCL PF 1 MG/ML IJ SOLN
0.2500 mg | INTRAMUSCULAR | Status: DC | PRN
  Filled 2013-09-23: qty 1

## 2013-09-23 MED ORDER — ONDANSETRON HCL 4 MG/2ML IJ SOLN
INTRAMUSCULAR | Status: DC | PRN
  Administered 2013-09-23: 4 mg via INTRAVENOUS

## 2013-09-23 MED ORDER — PROPOFOL 10 MG/ML IV BOLUS
INTRAVENOUS | Status: DC | PRN
  Administered 2013-09-23: 180 mg via INTRAVENOUS

## 2013-09-23 MED ORDER — PROMETHAZINE HCL 25 MG/ML IJ SOLN
6.2500 mg | INTRAMUSCULAR | Status: DC | PRN
  Filled 2013-09-23: qty 1

## 2013-09-23 MED ORDER — MIDAZOLAM HCL 5 MG/5ML IJ SOLN
INTRAMUSCULAR | Status: DC | PRN
  Administered 2013-09-23: 2 mg via INTRAVENOUS

## 2013-09-23 MED ORDER — FENTANYL CITRATE 0.05 MG/ML IJ SOLN
INTRAMUSCULAR | Status: DC | PRN
  Administered 2013-09-23: 100 ug via INTRAVENOUS

## 2013-09-23 MED ORDER — LACTATED RINGERS IV SOLN
INTRAVENOUS | Status: DC
  Filled 2013-09-23: qty 1000

## 2013-09-23 MED ORDER — IODINE STRONG (LUGOLS) 5 % PO SOLN
ORAL | Status: DC | PRN
  Administered 2013-09-23: 0.2 mL via ORAL

## 2013-09-23 MED ORDER — SODIUM CHLORIDE 0.9 % IR SOLN
Status: DC | PRN
  Administered 2013-09-23: 1

## 2013-09-23 MED ORDER — LIDOCAINE HCL (CARDIAC) 20 MG/ML IV SOLN
INTRAVENOUS | Status: DC | PRN
  Administered 2013-09-23: 50 mg via INTRAVENOUS

## 2013-09-23 SURGICAL SUPPLY — 54 items
APPLICATOR COTTON TIP 6IN STRL (MISCELLANEOUS) IMPLANT
BAG DECANTER FOR FLEXI CONT (MISCELLANEOUS) IMPLANT
BLADE SURG 11 STRL SS (BLADE) ×3 IMPLANT
BLADE SURG 15 STRL LF DISP TIS (BLADE) IMPLANT
BLADE SURG 15 STRL SS (BLADE)
CANISTER SUCTION 1200CC (MISCELLANEOUS) IMPLANT
CANISTER SUCTION 2500CC (MISCELLANEOUS) ×3 IMPLANT
CATH ROBINSON RED A/P 16FR (CATHETERS) IMPLANT
CLOTH BEACON ORANGE TIMEOUT ST (SAFETY) IMPLANT
COVER TABLE BACK 60X90 (DRAPES) ×3 IMPLANT
DRAPE LG THREE QUARTER DISP (DRAPES) ×3 IMPLANT
DRAPE UNDERBUTTOCKS STRL (DRAPE) ×3 IMPLANT
DRESSING TELFA 8X3 (GAUZE/BANDAGES/DRESSINGS) ×3 IMPLANT
ELECT BALL LEEP 3MM BLK (ELECTRODE) IMPLANT
ELECT BALL LEEP 5MM RED (ELECTRODE) IMPLANT
ELECT BLADE 6.5 .24CM SHAFT (ELECTRODE) IMPLANT
ELECT LOOP LEEP RND 10X10 YLW (CUTTING LOOP)
ELECT LOOP LEEP RND 15X12 GRN (CUTTING LOOP)
ELECT LOOP LEEP RND 20X12 WHT (CUTTING LOOP)
ELECT NEEDLE TIP 2.8 STRL (NEEDLE) IMPLANT
ELECT REM PT RETURN 9FT ADLT (ELECTROSURGICAL) ×3
ELECTRODE LOOP LP RND 10X10YLW (CUTTING LOOP) IMPLANT
ELECTRODE LOOP LP RND 15X12GRN (CUTTING LOOP) IMPLANT
ELECTRODE LOOP LP RND 20X12WHT (CUTTING LOOP) IMPLANT
ELECTRODE REM PT RTRN 9FT ADLT (ELECTROSURGICAL) ×2 IMPLANT
GLOVE BIO SURGEON STRL SZ7.5 (GLOVE) ×3 IMPLANT
GLOVE BIOGEL M STRL SZ7.5 (GLOVE) IMPLANT
GLOVE EUDERMIC 7 POWDERFREE (GLOVE) ×3 IMPLANT
GLOVE SURG SIGNA 7.5 PF LTX (GLOVE) ×3 IMPLANT
GOWN PREVENTION PLUS LG XLONG (DISPOSABLE) IMPLANT
GOWN STRL REIN XL XLG (GOWN DISPOSABLE) IMPLANT
GOWN STRL REUS W/TWL LRG LVL3 (GOWN DISPOSABLE) ×6 IMPLANT
LEGGING LITHOTOMY PAIR STRL (DRAPES) ×3 IMPLANT
NDL SAFETY ECLIPSE 18X1.5 (NEEDLE) IMPLANT
NEEDLE HYPO 18GX1.5 SHARP (NEEDLE)
NEEDLE SPNL 22GX3.5 QUINCKE BK (NEEDLE) IMPLANT
NS IRRIG 500ML POUR BTL (IV SOLUTION) ×3 IMPLANT
PACK BASIN DAY SURGERY FS (CUSTOM PROCEDURE TRAY) ×3 IMPLANT
PAD OB MATERNITY 4.3X12.25 (PERSONAL CARE ITEMS) ×3 IMPLANT
PAD PREP 24X48 CUFFED NSTRL (MISCELLANEOUS) ×3 IMPLANT
PENCIL BUTTON HOLSTER BLD 10FT (ELECTRODE) ×3 IMPLANT
SCOPETTES 8  STERILE (MISCELLANEOUS) ×1
SCOPETTES 8 STERILE (MISCELLANEOUS) ×2 IMPLANT
SUT VIC AB 2-0 SH 27 (SUTURE)
SUT VIC AB 2-0 SH 27XBRD (SUTURE) IMPLANT
SYR CONTROL 10ML LL (SYRINGE) IMPLANT
SYR TB 1ML LL NO SAFETY (SYRINGE) IMPLANT
TOWEL OR 17X24 6PK STRL BLUE (TOWEL DISPOSABLE) ×6 IMPLANT
TRAY DSU PREP LF (CUSTOM PROCEDURE TRAY) ×3 IMPLANT
TUBE CONNECTING 12X1/4 (SUCTIONS) ×3 IMPLANT
VACUUM HOSE 7/8X10 W/ WAND (MISCELLANEOUS) IMPLANT
VACUUM HOSE/TUBING 7/8INX6FT (MISCELLANEOUS) IMPLANT
WATER STERILE IRR 500ML POUR (IV SOLUTION) IMPLANT
YANKAUER SUCT BULB TIP NO VENT (SUCTIONS) ×3 IMPLANT

## 2013-09-23 NOTE — Op Note (Signed)
Preoperative Diagnosis: CIN3 possible microinvasion, h/o supracervical hysterectomy  Postoperative Diagnosis: Same  Procedure(s) Performed: Exam under anesthesia, cervical biopsy  Surgeon: Francetta Found.  Skeet Latch, M.D. PhD  Anesthesia:GET  Indication for Procedure: Patient s/p supracervical hysterectomy, Pap AGS, ECC CIS cannot r/o microinvasion, no lesions noted on the cervix.  Cervix approximately 1cm  Operative Findings: 1cm cervix flush with the vaginal vault.  Papillary tissue noted at the vaginal apex at the site of the palpable 1cm cervix.  Cervix sounded to 1cm.  No parametrial nodularity. No perirectal or uterosacral nodularity or thickening.   Procedure:  Melissa [name] was taken to the OR and placed under general endotracheal anesthesia. She was placed in the dorsal lithotomy position prepped and draped in usual sterile fashion. Under anesthesia papillary appearing tissue was noted at the vaginal apex. On palpation this is consistent with the site of the cervix that was palpably approximately 1 cm. The cervix was sounded to approximately 1 cm. The papillary-like tissue was removed, and an aggressive scraping of the endocervical canal performed.  Specimens: Cervical biopsies  Estimated Blood Loss: Minimal mL. Blood Replacement: None  Sponge, lap and needle counts were correct x 3.    Complications: None The patient had sequential compression devices for VTE prophylaxis.          Disposition: PACU - hemodynamically stable.         Condition: stable

## 2013-09-23 NOTE — Transfer of Care (Signed)
Immediate Anesthesia Transfer of Care Note  Patient: Melissa [name]  Procedure(s) Performed: Procedure(s): LOOP ELECTROSURGICAL EXCISION PROCEDURE (LEEP) OR POSSIBLE COLD KNIFE CONE (N/A) CONIZATION CERVIX WITH BIOPSY (N/A)  Patient Location: PACU  Anesthesia Type:General  Level of Consciousness: sedated  Airway & Oxygen Therapy: Patient Spontanous Breathing and Patient connected to face mask oxygen  Post-op Assessment: Report given to PACU RN and Post -op Vital signs reviewed and stable  Post vital signs: Reviewed and stable  Complications: No apparent anesthesia complications

## 2013-09-23 NOTE — Anesthesia Procedure Notes (Signed)
Procedure Name: LMA Insertion Performed by: Ginelle Bays, [place] Pre-anesthesia Checklist: Patient identified, Emergency Drugs available, Suction available and Patient being monitored Patient Re-evaluated:Patient Re-evaluated prior to inductionOxygen Delivery Method: Circle System Utilized Preoxygenation: Pre-oxygenation with 100% oxygen Intubation Type: IV induction Ventilation: Mask ventilation without difficulty LMA: LMA inserted LMA Size: 4.0 Number of attempts: 1 Airway Equipment and Method: bite block Placement Confirmation: positive ETCO2 Tube secured with: Tape Dental Injury: Teeth and Oropharynx as per pre-operative assessment      

## 2013-09-23 NOTE — Interval H&P Note (Signed)
History and Physical Interval Note:  09/23/2013 7:55 AM  Melissa [name]  has presented today for surgery, with the diagnosis of CERVICAL DYSPLASIA  The various methods of treatment have been discussed with the patient and family. After consideration of risks, benefits and other options for treatment, the patient has consented to  Procedure(s): LOOP ELECTROSURGICAL EXCISION PROCEDURE (LEEP) OR POSSIBLE COLD KNIFE CONE (N/A) CONIZATION CERVIX WITH BIOPSY (N/A) as a surgical intervention .  The patient's history has been reviewed, patient examined, no change in status, stable for surgery.  I have reviewed the patient's chart and labs.  Questions were answered to the patient's satisfaction.     [place], [facility]

## 2013-09-23 NOTE — H&P (View-Only) (Signed)
Consult Note: Gyn-Onc  Consult was requested by Dr.Kullish-Shaw for the evaluation of Melissa [name] 68 y.o. female  CC:  Chief Complaint  Patient presents with  . CIN III    New pt    Assessment/Plan:  Melissa [name]  is a 68 y.o.  year old status post supracervical hysterectomy who presented with AGUS Pap and positive HPV. Colposcopy is notable for an ECC demonstrating CIN-3/CIS with suspicion for microinvasion.  On 09/23/2013, Under anesthesia, a LEEP or cold knife will be collected to aid in the decision management restarting diagnosis and treatment    HPI: Melissa [name] is a 68 year old gravida 4 para 3 last moments appeared in her 42s a history remarkable for a supracervical hysterectomy because of a uterine mass. The patient denies any prior Pap tests for the last 10 years however Pap test prior to that were within normal limits. A Pap test was collected on 05/25/2013 and returned with atypical glandular cells of undetermined significance. Colposcopy was collected on 08/03/2013. Endocervical curettings were notable for strips of squamous epithelium with CIN-3 CIS favor microinvasion and endocervical glandular component was not identified.  Ms. Melissa [name] denies vaginal bleeding weight loss hematuria hematochezia. She reports chronic low back and leg pain   Current Meds:  Outpatient Encounter Prescriptions as of 09/09/2013  Medication Sig  . aspirin 81 MG tablet Take 81 mg by mouth daily.  Marland Kitchen atorvastatin (LIPITOR) 40 MG tablet Take 40 mg by mouth daily.  . Calcium Carbonate-Vitamin D (CALCIUM-VITAMIN D) 600-200 MG-UNIT CAPS Take by mouth 2 (two) times daily.  Marland Kitchen FLUoxetine (PROZAC) 20 MG capsule Take 20 mg by mouth daily.  Marland Kitchen glimepiride (AMARYL) 2 MG tablet Take 4 mg by mouth daily with breakfast.  . losartan-hydrochlorothiazide (HYZAAR) 50-12.5 MG per tablet Take 1 tablet by mouth daily.  . metFORMIN (GLUCOPHAGE) 500 MG tablet Take 500 mg by mouth 2 (two) times daily with a meal.  .  Multiple Vitamins-Minerals (MULTIVITAMIN PO) Take by mouth daily.  . risperiDONE (RISPERDAL) 2 MG tablet Take 2 mg by mouth at bedtime.  . traZODone (DESYREL) 50 MG tablet Take 50 mg by mouth at bedtime.  Marland Kitchen VITAMIN D, ERGOCALCIFEROL, PO Take by mouth daily.    Allergy:  Allergies  Allergen Reactions  . Sulfa Antibiotics Hives    Social Hx:   History   Social History  . Marital Status: Divorced    Spouse Name: N/A    Number of Children: N/A  . Years of Education: N/A   Occupational History  . Not on file.   Social History Main Topics  . Smoking status: Former Research scientist (life sciences)  . Smokeless tobacco: Not on file  . Alcohol Use: No     Comment: in the past  . Drug Use: No  . Sexual Activity: Not on file     Comment: Quit 25 years ago   Other Topics Concern  . Not on file   Social History Narrative  . No narrative on file    Past Surgical Hx:  Past Surgical History  Procedure Laterality Date  . Abdominal hysterectomy      25 years ago, "tumor growing on it"  . Tubal ligation      Past Medical Hx:  Past Medical History  Diagnosis Date  . Diabetes mellitus without complication   . Hypertension     Past Gynecological History: G4P3 LNMP 40's supracervical hysterectomy   No LMP recorded.  Family Hx:  Family History  Problem Relation Age  of Onset  . Hypertension Mother   . Hypertension Father   . Kidney cancer Sister   . Cancer Brother     Review of Systems:  Constitutional  Feels well, Cardiovascular  No chest pain, shortness of breath, or edema  Pulmonary  No cough or wheeze.  Gastro Intestinal  No nausea, vomitting, or diarrhoea. No bright red blood per rectum, no abdominal pain, change in bowel movement, or constipation.  Genito Urinary  No frequency, urgency, dysuria,no vaginal bleeding. Musculo Skeletal  No myalgia, arthralgia, joint swelling or pain  Neurologic  No weakness, numbness, change in gait,  Psychology  No depression, anxiety, insomnia.    Vitals:  Blood pressure 133/81, pulse 111, temperature 98.4 F (36.9 C), temperature source Oral, resp. rate 20, height 5' 2.99" (1.6 m), weight 202 lb 14.4 oz (92.035 kg).  Physical Exam: WD in NAD Neck  Supple NROM, without any enlargements.  Lymph Node Survey No cervical supraclavicular or inguinal adenopathy Cardiovascular  Pulse normal rate, regularity and rhythm.  Lungs  Clear to auscultation bilateraly, without wheezes/crackles/rhonchi. Psychiatry  Alert and oriented to person, place, and time poor short term recall able to remember 2/3 objects.  Not able to recall primary point of discussion.  Unable to do simple arithmitic Abdomen  Normoactive bowel sounds, abdomen soft, non-tender and obese.  Back No CVA tenderness Genito Urinary  Vulva/vagina: Normal external female genitalia.     Bladder/urethra:  No lesions or masses  Vagina:hypoestrogenic, no blood in the vault.    Cervix: Normal appearing,flush with the vaginal vault inferiorly.  Adnexa: No palpable masses. Rectal  Fecal matter all around the anus. Extremities  No bilateral cyanosis, clubbing or edema.   Melissa Morning, MD, PhD 09/09/2013, 5:01 PM

## 2013-09-23 NOTE — Discharge Instructions (Addendum)
Report to the [facility] for heavy vaginal bleeding.   For questions after hours please call [phone] and ask to speak to the GYN oncology fellow on call. Pelvic rest for 6 weeks. Please call the oncology office [phone] to schedule a follow up appointment. Cervical Dysplasia Cervical dysplasia is a condition in which a woman has abnormal changes in the cells of her cervix. The cervix is the opening to the uterus (womb). It is located between the vagina and the uterus. Cervical dysplasia may be the first sign of cervical cancer.  With early detection, treatment, and close follow-up care, nearly all cases of cervical dysplasia can be cured. If left untreated, dysplasia may become more severe.  CAUSES  Cervical dysplasia can be caused by a human papillomavirus (HPV) infection. RISK FACTORS   Having had a sexually transmitted disease, such as chlamydia or a human papillomavirus (HPV) infection.   Becoming sexually active before age 55.   Having had more than 1 sexual partner.   Not using protection during sexual intercourse, especially with new sexual partners.   Having had cancer of the vagina or vulva.   Having a sexual partner whose previous partner had cancer of the cervix or cervical dysplasia.   Having a sexual partner who has or has had cancer of the penis.   Having a weakened immune system (such as from having HIV or an organ transplant).   Being the daughter of a woman who took diethylstilbestrol(DES) during pregnancy.   Having a family history of cervical cancer.   Smoking. SIGNS AND SYMPTOMS  There are usually no symptoms. If there are symptoms, they may include:   Abnormal vaginal discharge.   Bleeding between periods or after intercourse.   Bleeding during menopause.   Pain during sexual intercourse (dyspareunia). DIAGNOSIS  A test called a Pap test may be done.During this test, cells are taken from the cervix and then looked at under  a microscope. A test in which tissue is removed from the cervix (biopsy) may also be done if the Pap test is abnormal or if the cervix looks abnormal.  TREATMENT  Treatment varies based on the severity of the cervical dysplasia. Treatment may include:  Cryotherapy. During cryotherapy, the abnormal cells are frozen with a steel-tip instrument.   A procedure to remove abnormal tissue from the cervix.  Surgery to remove abnormal tissue. This is usually done in serious cases of cervical dysplasia. Surgical options include:  A cone biopsy. This is a procedure in which the cervical canal and a portion of the center of the cervix are removed.   Hysterectomy. This is a surgery in which the uterus and cervix are removed. HOME CARE INSTRUCTIONS   Only take over-the-counter or prescription medicines for pain or discomfort as directed by your health care provider.   Do not use tampons, have sexual intercourse, or douche until your health care provider says it is OK.  Keep follow-up appointments as directed by your health care provider. Women who have been treated for cervical dysplasia should have regular pelvic exams and Pap tests. During the first year following treatment of cervical dysplasia, Pap tests should be done every 3 4 months. In the second year, they should be done every 6 months or as recommended by your health care provider.  To prevent the condition from developing again, practice safe sex. SEEK MEDICAL CARE IF:  You develop genital warts.  SEEK IMMEDIATE MEDICAL CARE IF:   Your menstrual period is heavier than normal.  You develop bright red bleeding, especially if you have blood clots.   You have a fever.   You have increasing cramps or pain not relieved with medicine.   You are lightheaded, unusually weak, or have fainting spells.   You have abnormal vaginal discharge.   You have abdominal pain. Document Released: 08/19/2005 Document Revised: 04/21/2013  Document Reviewed: 04/14/2013 [facility] Patient Information 2014 [place].  Post Anesthesia Home Care Instructions  Activity: Get plenty of rest for the remainder of the day. A responsible adult should stay with you for 24 hours following the procedure.  For the next 24 hours, DO NOT: -Drive a car -Paediatric nurse -Drink alcoholic beverages -Take any medication unless instructed by your physician -Make any legal decisions or sign important papers.  Meals: Start with liquid foods such as gelatin or soup. Progress to regular foods as tolerated. Avoid greasy, spicy, heavy foods. If nausea and/or vomiting occur, drink only clear liquids until the nausea and/or vomiting subsides. Call your physician if vomiting continues.  Special Instructions/Symptoms: Your throat may feel dry or sore from the anesthesia or the breathing tube placed in your throat during surgery. If this causes discomfort, gargle with warm salt water. The discomfort should disappear within 24 hours.

## 2013-09-24 ENCOUNTER — Encounter (HOSPITAL_BASED_OUTPATIENT_CLINIC_OR_DEPARTMENT_OTHER): Payer: Self-pay | Admitting: Gynecologic Oncology

## 2013-09-24 NOTE — Anesthesia Postprocedure Evaluation (Signed)
Anesthesia Post Note  Patient: Melissa [name]  Procedure(s) Performed: Procedure(s) (LRB): EUA, CERVICAL BIOPSY  (N/A)  Anesthesia type: General  Patient location: PACU  Post pain: Pain level controlled  Post assessment: Post-op Vital signs reviewed  Last Vitals:  Filed Vitals:   09/23/13 0936  BP: 116/77  Pulse: 89  Temp: 36 C  Resp: 16    Post vital signs: Reviewed  Level of consciousness: sedated  Complications: No apparent anesthesia complications

## 2013-09-27 ENCOUNTER — Other Ambulatory Visit: Payer: Self-pay | Admitting: Gynecologic Oncology

## 2013-09-27 DIAGNOSIS — C539 Malignant neoplasm of cervix uteri, unspecified: Secondary | ICD-10-CM

## 2013-09-27 NOTE — Progress Notes (Signed)
Dr. Skeet Latch spoke with the patient's daughter about final pathology.  To have a PET scan arranged.

## 2013-10-11 ENCOUNTER — Encounter (HOSPITAL_COMMUNITY)
Admission: RE | Admit: 2013-10-11 | Discharge: 2013-10-11 | Disposition: A | Discharge status: Home / Self Care | Payer: Medicare Other | Source: Ambulatory Visit | Attending: Gynecologic Oncology | Admitting: Gynecologic Oncology

## 2013-10-11 DIAGNOSIS — Z90710 Acquired absence of both cervix and uterus: Secondary | ICD-10-CM | POA: Diagnosis not present

## 2013-10-11 DIAGNOSIS — C539 Malignant neoplasm of cervix uteri, unspecified: Secondary | ICD-10-CM | POA: Insufficient documentation

## 2013-10-11 DIAGNOSIS — C760 Malignant neoplasm of head, face and neck: Secondary | ICD-10-CM | POA: Diagnosis not present

## 2013-10-11 LAB — GLUCOSE, CAPILLARY: GLUCOSE-CAPILLARY: 93 mg/dL (ref 70–99)

## 2013-10-11 MED ORDER — FLUDEOXYGLUCOSE F - 18 (FDG) INJECTION: 13.6000 | Freq: Once | INTRAVENOUS | Status: AC | PRN

## 2013-10-12 ENCOUNTER — Telehealth: Payer: Self-pay | Admitting: *Deleted

## 2013-10-12 NOTE — Telephone Encounter (Signed)
Message copied by GARNER, Aletha Halim on Tue Oct 12, 2013  8:22 AM ------      Message from: CROSS, MELISSA D      Created: Mon Oct 11, 2013  4:09 PM       PET with no evidence of metastasis or spread.       ----- Message -----         From: Rad Results In Interface         Sent: 10/11/2013   3:53 PM           To: Dorothyann Gibbs, NP                   ------

## 2013-10-12 NOTE — Telephone Encounter (Signed)
Per NP, called pt who was unavailable. Spoke to Vicente Males, gave results PET scan shows no evidence of metastasis or spread. Vicente Males verbalized understanding.

## 2013-10-14 ENCOUNTER — Ambulatory Visit: Payer: Medicare Other | Attending: Gynecologic Oncology | Admitting: Gynecologic Oncology

## 2013-10-14 ENCOUNTER — Encounter (INDEPENDENT_AMBULATORY_CARE_PROVIDER_SITE_OTHER): Payer: Self-pay

## 2013-10-14 ENCOUNTER — Encounter: Payer: Self-pay | Admitting: Gynecologic Oncology

## 2013-10-14 VITALS — BP 146/82 | HR 100 | Temp 98.7°F | Resp 20 | Ht 62.99 in | Wt 201.7 lb

## 2013-10-14 DIAGNOSIS — E785 Hyperlipidemia, unspecified: Secondary | ICD-10-CM | POA: Insufficient documentation

## 2013-10-14 DIAGNOSIS — C539 Malignant neoplasm of cervix uteri, unspecified: Secondary | ICD-10-CM | POA: Diagnosis not present

## 2013-10-14 DIAGNOSIS — I10 Essential (primary) hypertension: Secondary | ICD-10-CM | POA: Diagnosis not present

## 2013-10-14 DIAGNOSIS — K219 Gastro-esophageal reflux disease without esophagitis: Secondary | ICD-10-CM | POA: Insufficient documentation

## 2013-10-14 DIAGNOSIS — Z7982 Long term (current) use of aspirin: Secondary | ICD-10-CM | POA: Insufficient documentation

## 2013-10-14 DIAGNOSIS — Z87891 Personal history of nicotine dependence: Secondary | ICD-10-CM | POA: Insufficient documentation

## 2013-10-14 DIAGNOSIS — Z79899 Other long term (current) drug therapy: Secondary | ICD-10-CM | POA: Diagnosis not present

## 2013-10-14 DIAGNOSIS — G4733 Obstructive sleep apnea (adult) (pediatric): Secondary | ICD-10-CM | POA: Diagnosis not present

## 2013-10-14 DIAGNOSIS — E119 Type 2 diabetes mellitus without complications: Secondary | ICD-10-CM | POA: Insufficient documentation

## 2013-10-14 NOTE — Progress Notes (Signed)
Gyn-Onc Note:   Melissa [name] 68 y.o. female  CC:  Chief Complaint  Patient presents with  . Cervical Cancer    Assessment/Plan:  Ms. Melissa [name]  is a 68 y.o.  year old status post supracervical hysterectomy remotely, who was noted to have  A pap 05/2013 with AGUS and positive HPV. Initial colposcopy 08/2013  was notable for an ECC demonstrating CIN-3/CIS with suspicion for microinvasion.  On 09/23/2013, Under anesthesia, gross diease papillary disease was present c/w moderately differentiated SCCA.   This is a 68 y.o. year old with a new diagnosis of a clinical Stage IB1 carcinoma of the cervix.  The patient was counseled extensively on the treatment options for an early cervical cancer  That are radical tracehlectomy, pelvic lymph node dissection +/- BSO versus chemo-radiation with daily external beam radiation followed by vaginal brachytherapy. We discussed equal cure rates and survival with each modality. We discussed the differences in toxicity, including fistula, bowel obstruction, radiation proctitis/enteritis/cystitis with XRT and long term sequela versus the risks of radical hysterectomy.  After counseling and consideration of her options, the patient desires surgical management.   I extensively reviewed the risks of radical hysterectomy and lymph node dissection including infection, bleeding, damage to surrounding structures including bowel, bladder, vessels, and ureters. We discussed postoperative risks including infection, PE/ DVT, and risk of bowel and bladder dysfunction and lymphedema. We also reviewed possible need for further treatment such as radiation or chemotherapy based on final pathology. Although given the small volume of disease this is unlikely.   I counseled her on need for foley catheter x 1 week.  She was given the opportunity to ask questions, which were answered to her and that of her daughter and daughter-in-law are in agreement with the above mentioned plan of  care.  She is aware that the surgery will be performed by Dr. Clarene Essex at [facility] on 2.27/2015.  Pre op at [facility] on  10/22/2013.  HPI: Melissa [name] is a 68 year old gravida 4 para 3 last moments appeared in her 1s a history remarkable for a supracervical hysterectomy because of a uterine mass. The patient denies any prior Pap tests for the last 10 years however Pap test prior to that were within normal limits. A Pap test was collected on 05/25/2013 and returned with atypical glandular cells of undetermined significance. Colposcopy was collected on 08/03/2013. Endocervical curettings were notable for strips of squamous epithelium with CIN-3 CIS favor microinvasion and endocervical glandular component was not identified.  Ms. Melissa [name] was taken to the OR 09/23/2013 for  EUA and CKC.  Intraoperatively gross disease was noted on the cervix, papillary lesions measuring approximately 1cm.  Lesion was removed.  Path c/w moderately differentiated  SCCA.  PET: 10/11/2009 IMPRESSION:  1. No evidence of local cervical cancer metastasis.  2. No evidence of nodal metastasis.  3. Small left obturator lymph node with minimal metabolic activity is likely benign although indeterminate   Current Meds:  Outpatient Encounter Prescriptions as of 10/14/2013  Medication Sig  . aspirin 81 MG tablet Take 81 mg by mouth daily.  Marland Kitchen atorvastatin (LIPITOR) 40 MG tablet Take 40 mg by mouth every morning.   . Calcium Carbonate-Vitamin D (CALCIUM-VITAMIN D) 600-200 MG-UNIT CAPS Take by mouth 2 (two) times daily.  . Cholecalciferol (VITAMIN D3) 2000 UNITS TABS Take 1 capsule by mouth daily.  Marland Kitchen FLUoxetine (PROZAC) 20 MG capsule Take 20 mg by mouth every morning.   Marland Kitchen glimepiride (AMARYL) 4 MG tablet Take  4 mg by mouth daily with breakfast.  . losartan-hydrochlorothiazide (HYZAAR) 50-12.5 MG per tablet Take 1 tablet by mouth every morning.   . metFORMIN (GLUCOPHAGE) 500 MG tablet Take 500 mg by mouth 2 (two) times daily with a meal.  .  Multiple Vitamins-Minerals (MULTIVITAMIN PO) Take by mouth daily.  Marland Kitchen omeprazole (PRILOSEC) 20 MG capsule Take 20 mg by mouth as needed.  . risperiDONE (RISPERDAL) 2 MG tablet Take 2 mg by mouth at bedtime.  . topiramate (TOPAMAX) 25 MG tablet Take 25 mg by mouth at bedtime.  . traZODone (DESYREL) 50 MG tablet Take 50 mg by mouth at bedtime.    Allergy:  Allergies  Allergen Reactions  . Sulfa Antibiotics Hives    Social Hx:   History   Social History  . Marital Status: Divorced    Spouse Name: N/A    Number of Children: N/A  . Years of Education: N/A   Occupational History  . Not on file.   Social History Main Topics  . Smoking status: Former Smoker -- 1.00 packs/day    Types: Cigarettes    Quit date: 09/18/1987  . Smokeless tobacco: Never Used  . Alcohol Use: No  . Drug Use: No  . Sexual Activity: Not on file   Other Topics Concern  . Not on file   Social History Narrative  . No narrative on file    Past Surgical Hx:  Past Surgical History  Procedure Laterality Date  . Tubal ligation    . Supracervical abdominal hysterectomy  1990 (approx)  . Esophageal biopsy N/A 09/23/2013    Procedure: EUA, CERVICAL BIOPSY ;  Surgeon: Janie Morning, MD;  Location: [facility];  Service: Gynecology;  Laterality: N/A;    Past Medical Hx:  Past Medical History  Diagnosis Date  . Hypertension   . Type 2 diabetes mellitus   . Dyslipidemia   . CIN III (cervical intraepithelial neoplasia grade III) with severe dysplasia   . GERD (gastroesophageal reflux disease)   . Arthritis   . Wears glasses   . Poor historian   . OSA on CPAP   . Bipolar 1 disorder     W/ HX MAJOR DEPRESSION W/ PSYCHOTIC FEATURE 2004    Past Gynecological History: G4P3 LNMP 40's supracervical hysterectomy   No LMP recorded. Patient has had a hysterectomy. No pap for 10 years prior to the most recent abnormal pap test.  Family Hx:  Family History  Problem Relation Age of Onset  .  Hypertension Mother   . Hypertension Father   . Kidney cancer Sister   . Cancer Brother     Review of Systems:  Constitutional  Feels well, Cardiovascular  No chest pain, shortness of breath, or edema  Pulmonary  No cough or wheeze.  Gastro Intestinal  No nausea, vomitting, or diarrhoea. No bright red blood per rectum, no abdominal pain, change in bowel movement, or constipation.  Genito Urinary  No frequency, urgency, dysuria,no vaginal bleeding. Musculo Skeletal  No myalgia, arthralgia, joint swelling or pain  Neurologic  No weakness, numbness, change in gait, no falls Psychology  No depression, anxiety, insomnia. Poor memory  Vitals:  Blood pressure 146/82, pulse 100, temperature 98.7 F (37.1 C), temperature source Oral, resp. rate 20, height 5' 2.99" (1.6 m), weight 201 lb 11.2 oz (91.491 kg), SpO2 97.00%. Body mass index is 35.74 kg/(m^2).   Physical Exam: WD in NAD Neck  Supple NROM, without any enlargements.  Lymph Node Survey No cervical  supraclavicular or inguinal adenopathy Cardiovascular  Pulse normal rate, regularity and rhythm.  Lungs  Clear to auscultation bilateraly,  Psychiatry  Alert and oriented to person, place, and time poor short term recall able to remember 2/3 objects.  Not able to recall primary point of discussion.  Unable to do simple arithmitic Abdomen  Normoactive bowel sounds, abdomen soft, non-tender and obese.  Back No CVA tenderness Genito Urinary  Vulva/vagina: Normal external female genitalia.     Bladder/urethra:  No lesions or masses  Vagina:hypoestrogenic, no blood in the vault.    Cervix: 1 cm ,flush with the vaginal vault inferiorly. Grossly visible papillary lesion visible 1cm.  No parametrial nodularity, no vaginal extension  Adnexa: No palpable masses. Rectal  Fecal matter around the anus. Extremities  No bilateral cyanosis, clubbing or edema.   Janie Morning, MD, PhD 10/14/2013, 1:59 PM

## 2013-10-14 NOTE — Patient Instructions (Signed)
Pre-operative appointment at [facility] on Feb 20 at 1:00pm.  Surgery is scheduled for Feb 27.

## 2013-10-16 DIAGNOSIS — J069 Acute upper respiratory infection, unspecified: Secondary | ICD-10-CM | POA: Diagnosis not present

## 2013-10-22 DIAGNOSIS — F319 Bipolar disorder, unspecified: Secondary | ICD-10-CM | POA: Diagnosis not present

## 2013-10-22 DIAGNOSIS — Z01818 Encounter for other preprocedural examination: Secondary | ICD-10-CM | POA: Diagnosis not present

## 2013-10-22 DIAGNOSIS — C539 Malignant neoplasm of cervix uteri, unspecified: Secondary | ICD-10-CM | POA: Diagnosis not present

## 2013-10-22 DIAGNOSIS — I10 Essential (primary) hypertension: Secondary | ICD-10-CM | POA: Diagnosis not present

## 2013-10-22 DIAGNOSIS — I4949 Other premature depolarization: Secondary | ICD-10-CM | POA: Diagnosis not present

## 2013-10-22 DIAGNOSIS — E119 Type 2 diabetes mellitus without complications: Secondary | ICD-10-CM | POA: Diagnosis not present

## 2013-10-22 DIAGNOSIS — E669 Obesity, unspecified: Secondary | ICD-10-CM | POA: Diagnosis not present

## 2013-10-22 DIAGNOSIS — G4733 Obstructive sleep apnea (adult) (pediatric): Secondary | ICD-10-CM | POA: Diagnosis not present

## 2013-10-22 DIAGNOSIS — F29 Unspecified psychosis not due to a substance or known physiological condition: Secondary | ICD-10-CM | POA: Diagnosis not present

## 2013-10-22 DIAGNOSIS — Z79899 Other long term (current) drug therapy: Secondary | ICD-10-CM | POA: Diagnosis not present

## 2013-10-22 DIAGNOSIS — E785 Hyperlipidemia, unspecified: Secondary | ICD-10-CM | POA: Diagnosis not present

## 2013-10-29 DIAGNOSIS — C539 Malignant neoplasm of cervix uteri, unspecified: Secondary | ICD-10-CM | POA: Diagnosis not present

## 2013-11-06 DIAGNOSIS — Z466 Encounter for fitting and adjustment of urinary device: Secondary | ICD-10-CM | POA: Diagnosis not present

## 2013-11-06 DIAGNOSIS — T83091A Other mechanical complication of indwelling urethral catheter, initial encounter: Secondary | ICD-10-CM | POA: Diagnosis not present

## 2013-11-06 DIAGNOSIS — Z79899 Other long term (current) drug therapy: Secondary | ICD-10-CM | POA: Diagnosis not present

## 2013-11-06 DIAGNOSIS — I10 Essential (primary) hypertension: Secondary | ICD-10-CM | POA: Diagnosis not present

## 2013-11-06 DIAGNOSIS — Y846 Urinary catheterization as the cause of abnormal reaction of the patient, or of later complication, without mention of misadventure at the time of the procedure: Secondary | ICD-10-CM | POA: Diagnosis not present

## 2013-11-06 DIAGNOSIS — E7800 Pure hypercholesterolemia, unspecified: Secondary | ICD-10-CM | POA: Diagnosis not present

## 2013-11-06 DIAGNOSIS — E119 Type 2 diabetes mellitus without complications: Secondary | ICD-10-CM | POA: Diagnosis not present

## 2013-11-06 DIAGNOSIS — Z7982 Long term (current) use of aspirin: Secondary | ICD-10-CM | POA: Diagnosis not present

## 2013-11-08 DIAGNOSIS — Z4682 Encounter for fitting and adjustment of non-vascular catheter: Secondary | ICD-10-CM | POA: Diagnosis not present

## 2013-11-08 DIAGNOSIS — C539 Malignant neoplasm of cervix uteri, unspecified: Secondary | ICD-10-CM | POA: Diagnosis not present

## 2013-11-09 DIAGNOSIS — I10 Essential (primary) hypertension: Secondary | ICD-10-CM | POA: Diagnosis not present

## 2013-11-09 DIAGNOSIS — Z6835 Body mass index (BMI) 35.0-35.9, adult: Secondary | ICD-10-CM | POA: Diagnosis not present

## 2013-11-09 DIAGNOSIS — E119 Type 2 diabetes mellitus without complications: Secondary | ICD-10-CM | POA: Diagnosis not present

## 2013-11-09 DIAGNOSIS — F489 Nonpsychotic mental disorder, unspecified: Secondary | ICD-10-CM | POA: Diagnosis not present

## 2013-11-15 DIAGNOSIS — R3915 Urgency of urination: Secondary | ICD-10-CM | POA: Diagnosis not present

## 2013-11-15 DIAGNOSIS — N200 Calculus of kidney: Secondary | ICD-10-CM | POA: Diagnosis not present

## 2013-11-15 DIAGNOSIS — C539 Malignant neoplasm of cervix uteri, unspecified: Secondary | ICD-10-CM | POA: Diagnosis not present

## 2013-11-15 DIAGNOSIS — K5730 Diverticulosis of large intestine without perforation or abscess without bleeding: Secondary | ICD-10-CM | POA: Diagnosis not present

## 2013-11-24 DIAGNOSIS — I10 Essential (primary) hypertension: Secondary | ICD-10-CM | POA: Diagnosis not present

## 2013-11-24 DIAGNOSIS — E119 Type 2 diabetes mellitus without complications: Secondary | ICD-10-CM | POA: Diagnosis not present

## 2013-11-24 DIAGNOSIS — E785 Hyperlipidemia, unspecified: Secondary | ICD-10-CM | POA: Diagnosis not present

## 2013-11-24 DIAGNOSIS — G4733 Obstructive sleep apnea (adult) (pediatric): Secondary | ICD-10-CM | POA: Diagnosis not present

## 2013-11-24 DIAGNOSIS — Z6835 Body mass index (BMI) 35.0-35.9, adult: Secondary | ICD-10-CM | POA: Diagnosis not present

## 2013-11-26 DIAGNOSIS — N390 Urinary tract infection, site not specified: Secondary | ICD-10-CM | POA: Diagnosis not present

## 2013-11-26 DIAGNOSIS — Z6834 Body mass index (BMI) 34.0-34.9, adult: Secondary | ICD-10-CM | POA: Diagnosis not present

## 2013-12-06 DIAGNOSIS — R21 Rash and other nonspecific skin eruption: Secondary | ICD-10-CM | POA: Diagnosis not present

## 2013-12-06 DIAGNOSIS — E119 Type 2 diabetes mellitus without complications: Secondary | ICD-10-CM | POA: Diagnosis not present

## 2013-12-06 DIAGNOSIS — R32 Unspecified urinary incontinence: Secondary | ICD-10-CM | POA: Diagnosis not present

## 2013-12-06 DIAGNOSIS — E669 Obesity, unspecified: Secondary | ICD-10-CM | POA: Diagnosis not present

## 2013-12-06 DIAGNOSIS — Z8051 Family history of malignant neoplasm of kidney: Secondary | ICD-10-CM | POA: Diagnosis not present

## 2013-12-06 DIAGNOSIS — I10 Essential (primary) hypertension: Secondary | ICD-10-CM | POA: Diagnosis not present

## 2013-12-06 DIAGNOSIS — E785 Hyperlipidemia, unspecified: Secondary | ICD-10-CM | POA: Diagnosis not present

## 2013-12-06 DIAGNOSIS — F319 Bipolar disorder, unspecified: Secondary | ICD-10-CM | POA: Diagnosis not present

## 2013-12-06 DIAGNOSIS — F29 Unspecified psychosis not due to a substance or known physiological condition: Secondary | ICD-10-CM | POA: Diagnosis not present

## 2013-12-06 DIAGNOSIS — Z87891 Personal history of nicotine dependence: Secondary | ICD-10-CM | POA: Diagnosis not present

## 2013-12-06 DIAGNOSIS — C539 Malignant neoplasm of cervix uteri, unspecified: Secondary | ICD-10-CM | POA: Diagnosis not present

## 2013-12-17 DIAGNOSIS — C539 Malignant neoplasm of cervix uteri, unspecified: Secondary | ICD-10-CM | POA: Diagnosis not present

## 2013-12-21 DIAGNOSIS — Z7982 Long term (current) use of aspirin: Secondary | ICD-10-CM | POA: Diagnosis not present

## 2013-12-21 DIAGNOSIS — G4733 Obstructive sleep apnea (adult) (pediatric): Secondary | ICD-10-CM | POA: Diagnosis present

## 2013-12-21 DIAGNOSIS — E785 Hyperlipidemia, unspecified: Secondary | ICD-10-CM | POA: Diagnosis present

## 2013-12-21 DIAGNOSIS — C539 Malignant neoplasm of cervix uteri, unspecified: Secondary | ICD-10-CM | POA: Diagnosis not present

## 2013-12-21 DIAGNOSIS — I10 Essential (primary) hypertension: Secondary | ICD-10-CM | POA: Diagnosis not present

## 2013-12-21 DIAGNOSIS — Z87891 Personal history of nicotine dependence: Secondary | ICD-10-CM | POA: Diagnosis not present

## 2013-12-21 DIAGNOSIS — N821 Other female urinary-genital tract fistulae: Secondary | ICD-10-CM | POA: Diagnosis not present

## 2013-12-21 DIAGNOSIS — N829 Female genital tract fistula, unspecified: Secondary | ICD-10-CM | POA: Diagnosis present

## 2013-12-21 DIAGNOSIS — F319 Bipolar disorder, unspecified: Secondary | ICD-10-CM | POA: Diagnosis not present

## 2013-12-21 DIAGNOSIS — E669 Obesity, unspecified: Secondary | ICD-10-CM | POA: Diagnosis present

## 2013-12-21 DIAGNOSIS — Z8541 Personal history of malignant neoplasm of cervix uteri: Secondary | ICD-10-CM | POA: Diagnosis not present

## 2013-12-21 DIAGNOSIS — Z6838 Body mass index (BMI) 38.0-38.9, adult: Secondary | ICD-10-CM | POA: Diagnosis not present

## 2013-12-21 DIAGNOSIS — Z882 Allergy status to sulfonamides status: Secondary | ICD-10-CM | POA: Diagnosis not present

## 2013-12-21 DIAGNOSIS — E119 Type 2 diabetes mellitus without complications: Secondary | ICD-10-CM | POA: Diagnosis present

## 2013-12-21 DIAGNOSIS — Z79899 Other long term (current) drug therapy: Secondary | ICD-10-CM | POA: Diagnosis not present

## 2013-12-22 DIAGNOSIS — Z6838 Body mass index (BMI) 38.0-38.9, adult: Secondary | ICD-10-CM | POA: Diagnosis not present

## 2013-12-22 DIAGNOSIS — E119 Type 2 diabetes mellitus without complications: Secondary | ICD-10-CM | POA: Diagnosis not present

## 2013-12-22 DIAGNOSIS — N829 Female genital tract fistula, unspecified: Secondary | ICD-10-CM | POA: Diagnosis not present

## 2013-12-22 DIAGNOSIS — F319 Bipolar disorder, unspecified: Secondary | ICD-10-CM | POA: Diagnosis not present

## 2013-12-22 DIAGNOSIS — N821 Other female urinary-genital tract fistulae: Secondary | ICD-10-CM | POA: Diagnosis not present

## 2013-12-22 DIAGNOSIS — E785 Hyperlipidemia, unspecified: Secondary | ICD-10-CM | POA: Diagnosis not present

## 2013-12-22 DIAGNOSIS — I10 Essential (primary) hypertension: Secondary | ICD-10-CM | POA: Diagnosis not present

## 2013-12-22 DIAGNOSIS — C539 Malignant neoplasm of cervix uteri, unspecified: Secondary | ICD-10-CM | POA: Diagnosis not present

## 2013-12-24 DIAGNOSIS — E119 Type 2 diabetes mellitus without complications: Secondary | ICD-10-CM | POA: Diagnosis present

## 2013-12-24 DIAGNOSIS — E785 Hyperlipidemia, unspecified: Secondary | ICD-10-CM | POA: Diagnosis present

## 2013-12-24 DIAGNOSIS — T8183XA Persistent postprocedural fistula, initial encounter: Secondary | ICD-10-CM | POA: Diagnosis present

## 2013-12-24 DIAGNOSIS — G4733 Obstructive sleep apnea (adult) (pediatric): Secondary | ICD-10-CM | POA: Diagnosis present

## 2013-12-24 DIAGNOSIS — Z436 Encounter for attention to other artificial openings of urinary tract: Secondary | ICD-10-CM | POA: Diagnosis not present

## 2013-12-24 DIAGNOSIS — N821 Other female urinary-genital tract fistulae: Secondary | ICD-10-CM | POA: Diagnosis not present

## 2013-12-24 DIAGNOSIS — Z8541 Personal history of malignant neoplasm of cervix uteri: Secondary | ICD-10-CM | POA: Diagnosis not present

## 2013-12-24 DIAGNOSIS — N135 Crossing vessel and stricture of ureter without hydronephrosis: Secondary | ICD-10-CM | POA: Diagnosis not present

## 2013-12-24 DIAGNOSIS — I10 Essential (primary) hypertension: Secondary | ICD-10-CM | POA: Diagnosis present

## 2013-12-24 DIAGNOSIS — E669 Obesity, unspecified: Secondary | ICD-10-CM | POA: Diagnosis present

## 2013-12-24 DIAGNOSIS — F319 Bipolar disorder, unspecified: Secondary | ICD-10-CM | POA: Diagnosis present

## 2013-12-24 DIAGNOSIS — N736 Female pelvic peritoneal adhesions (postinfective): Secondary | ICD-10-CM | POA: Diagnosis present

## 2013-12-29 DIAGNOSIS — I10 Essential (primary) hypertension: Secondary | ICD-10-CM | POA: Diagnosis not present

## 2013-12-29 DIAGNOSIS — E119 Type 2 diabetes mellitus without complications: Secondary | ICD-10-CM | POA: Diagnosis not present

## 2013-12-29 DIAGNOSIS — F329 Major depressive disorder, single episode, unspecified: Secondary | ICD-10-CM | POA: Diagnosis not present

## 2013-12-29 DIAGNOSIS — F3289 Other specified depressive episodes: Secondary | ICD-10-CM | POA: Diagnosis not present

## 2013-12-29 DIAGNOSIS — N829 Female genital tract fistula, unspecified: Secondary | ICD-10-CM | POA: Diagnosis not present

## 2013-12-29 DIAGNOSIS — Z466 Encounter for fitting and adjustment of urinary device: Secondary | ICD-10-CM | POA: Diagnosis not present

## 2013-12-29 DIAGNOSIS — Z794 Long term (current) use of insulin: Secondary | ICD-10-CM | POA: Diagnosis not present

## 2013-12-30 DIAGNOSIS — Z6834 Body mass index (BMI) 34.0-34.9, adult: Secondary | ICD-10-CM | POA: Diagnosis not present

## 2013-12-30 DIAGNOSIS — R32 Unspecified urinary incontinence: Secondary | ICD-10-CM | POA: Diagnosis not present

## 2013-12-30 DIAGNOSIS — R000 Tachycardia, unspecified: Secondary | ICD-10-CM | POA: Diagnosis not present

## 2013-12-30 DIAGNOSIS — I10 Essential (primary) hypertension: Secondary | ICD-10-CM | POA: Diagnosis not present

## 2013-12-31 DIAGNOSIS — Z794 Long term (current) use of insulin: Secondary | ICD-10-CM | POA: Diagnosis not present

## 2013-12-31 DIAGNOSIS — F3289 Other specified depressive episodes: Secondary | ICD-10-CM | POA: Diagnosis not present

## 2013-12-31 DIAGNOSIS — E119 Type 2 diabetes mellitus without complications: Secondary | ICD-10-CM | POA: Diagnosis not present

## 2013-12-31 DIAGNOSIS — I10 Essential (primary) hypertension: Secondary | ICD-10-CM | POA: Diagnosis not present

## 2013-12-31 DIAGNOSIS — N829 Female genital tract fistula, unspecified: Secondary | ICD-10-CM | POA: Diagnosis not present

## 2013-12-31 DIAGNOSIS — Z466 Encounter for fitting and adjustment of urinary device: Secondary | ICD-10-CM | POA: Diagnosis not present

## 2013-12-31 DIAGNOSIS — Z79899 Other long term (current) drug therapy: Secondary | ICD-10-CM | POA: Diagnosis not present

## 2013-12-31 DIAGNOSIS — F329 Major depressive disorder, single episode, unspecified: Secondary | ICD-10-CM | POA: Diagnosis not present

## 2014-01-03 DIAGNOSIS — Z794 Long term (current) use of insulin: Secondary | ICD-10-CM | POA: Diagnosis not present

## 2014-01-03 DIAGNOSIS — N390 Urinary tract infection, site not specified: Secondary | ICD-10-CM | POA: Diagnosis not present

## 2014-01-03 DIAGNOSIS — I10 Essential (primary) hypertension: Secondary | ICD-10-CM | POA: Diagnosis not present

## 2014-01-03 DIAGNOSIS — F329 Major depressive disorder, single episode, unspecified: Secondary | ICD-10-CM | POA: Diagnosis not present

## 2014-01-03 DIAGNOSIS — F3289 Other specified depressive episodes: Secondary | ICD-10-CM | POA: Diagnosis not present

## 2014-01-03 DIAGNOSIS — Z466 Encounter for fitting and adjustment of urinary device: Secondary | ICD-10-CM | POA: Diagnosis not present

## 2014-01-03 DIAGNOSIS — N829 Female genital tract fistula, unspecified: Secondary | ICD-10-CM | POA: Diagnosis not present

## 2014-01-03 DIAGNOSIS — E119 Type 2 diabetes mellitus without complications: Secondary | ICD-10-CM | POA: Diagnosis not present

## 2014-01-07 DIAGNOSIS — R32 Unspecified urinary incontinence: Secondary | ICD-10-CM | POA: Diagnosis not present

## 2014-01-07 DIAGNOSIS — N821 Other female urinary-genital tract fistulae: Secondary | ICD-10-CM | POA: Diagnosis not present

## 2014-01-10 DIAGNOSIS — N829 Female genital tract fistula, unspecified: Secondary | ICD-10-CM | POA: Diagnosis not present

## 2014-01-10 DIAGNOSIS — Z466 Encounter for fitting and adjustment of urinary device: Secondary | ICD-10-CM | POA: Diagnosis not present

## 2014-01-10 DIAGNOSIS — F329 Major depressive disorder, single episode, unspecified: Secondary | ICD-10-CM | POA: Diagnosis not present

## 2014-01-10 DIAGNOSIS — E119 Type 2 diabetes mellitus without complications: Secondary | ICD-10-CM | POA: Diagnosis not present

## 2014-01-10 DIAGNOSIS — Z794 Long term (current) use of insulin: Secondary | ICD-10-CM | POA: Diagnosis not present

## 2014-01-10 DIAGNOSIS — I10 Essential (primary) hypertension: Secondary | ICD-10-CM | POA: Diagnosis not present

## 2014-01-10 DIAGNOSIS — F3289 Other specified depressive episodes: Secondary | ICD-10-CM | POA: Diagnosis not present

## 2014-01-13 DIAGNOSIS — R002 Palpitations: Secondary | ICD-10-CM | POA: Diagnosis not present

## 2014-01-13 DIAGNOSIS — I10 Essential (primary) hypertension: Secondary | ICD-10-CM | POA: Diagnosis not present

## 2014-01-13 DIAGNOSIS — Z6833 Body mass index (BMI) 33.0-33.9, adult: Secondary | ICD-10-CM | POA: Diagnosis not present

## 2014-01-13 DIAGNOSIS — E876 Hypokalemia: Secondary | ICD-10-CM | POA: Diagnosis not present

## 2014-01-17 DIAGNOSIS — Z466 Encounter for fitting and adjustment of urinary device: Secondary | ICD-10-CM | POA: Diagnosis not present

## 2014-01-17 DIAGNOSIS — E119 Type 2 diabetes mellitus without complications: Secondary | ICD-10-CM | POA: Diagnosis not present

## 2014-01-17 DIAGNOSIS — I10 Essential (primary) hypertension: Secondary | ICD-10-CM | POA: Diagnosis not present

## 2014-01-17 DIAGNOSIS — F3289 Other specified depressive episodes: Secondary | ICD-10-CM | POA: Diagnosis not present

## 2014-01-17 DIAGNOSIS — Z794 Long term (current) use of insulin: Secondary | ICD-10-CM | POA: Diagnosis not present

## 2014-01-17 DIAGNOSIS — N829 Female genital tract fistula, unspecified: Secondary | ICD-10-CM | POA: Diagnosis not present

## 2014-01-17 DIAGNOSIS — F329 Major depressive disorder, single episode, unspecified: Secondary | ICD-10-CM | POA: Diagnosis not present

## 2014-01-20 DIAGNOSIS — N135 Crossing vessel and stricture of ureter without hydronephrosis: Secondary | ICD-10-CM | POA: Diagnosis not present

## 2014-01-24 DIAGNOSIS — Z794 Long term (current) use of insulin: Secondary | ICD-10-CM | POA: Diagnosis not present

## 2014-01-24 DIAGNOSIS — E119 Type 2 diabetes mellitus without complications: Secondary | ICD-10-CM | POA: Diagnosis not present

## 2014-01-24 DIAGNOSIS — N829 Female genital tract fistula, unspecified: Secondary | ICD-10-CM | POA: Diagnosis not present

## 2014-01-24 DIAGNOSIS — Z466 Encounter for fitting and adjustment of urinary device: Secondary | ICD-10-CM | POA: Diagnosis not present

## 2014-01-24 DIAGNOSIS — I10 Essential (primary) hypertension: Secondary | ICD-10-CM | POA: Diagnosis not present

## 2014-01-24 DIAGNOSIS — F3289 Other specified depressive episodes: Secondary | ICD-10-CM | POA: Diagnosis not present

## 2014-01-24 DIAGNOSIS — F329 Major depressive disorder, single episode, unspecified: Secondary | ICD-10-CM | POA: Diagnosis not present

## 2014-01-28 DIAGNOSIS — F3189 Other bipolar disorder: Secondary | ICD-10-CM | POA: Diagnosis not present

## 2014-02-01 DIAGNOSIS — M948X9 Other specified disorders of cartilage, unspecified sites: Secondary | ICD-10-CM | POA: Diagnosis not present

## 2014-02-01 DIAGNOSIS — K5730 Diverticulosis of large intestine without perforation or abscess without bleeding: Secondary | ICD-10-CM | POA: Diagnosis not present

## 2014-02-01 DIAGNOSIS — N200 Calculus of kidney: Secondary | ICD-10-CM | POA: Diagnosis not present

## 2014-02-01 DIAGNOSIS — R32 Unspecified urinary incontinence: Secondary | ICD-10-CM | POA: Diagnosis not present

## 2014-02-14 DIAGNOSIS — R32 Unspecified urinary incontinence: Secondary | ICD-10-CM | POA: Diagnosis not present

## 2014-02-14 DIAGNOSIS — C539 Malignant neoplasm of cervix uteri, unspecified: Secondary | ICD-10-CM | POA: Diagnosis not present

## 2014-02-14 DIAGNOSIS — N821 Other female urinary-genital tract fistulae: Secondary | ICD-10-CM | POA: Diagnosis not present

## 2014-02-19 DIAGNOSIS — E119 Type 2 diabetes mellitus without complications: Secondary | ICD-10-CM | POA: Diagnosis not present

## 2014-02-19 DIAGNOSIS — N390 Urinary tract infection, site not specified: Secondary | ICD-10-CM | POA: Diagnosis not present

## 2014-02-19 DIAGNOSIS — R1033 Periumbilical pain: Secondary | ICD-10-CM | POA: Diagnosis not present

## 2014-02-19 DIAGNOSIS — R109 Unspecified abdominal pain: Secondary | ICD-10-CM | POA: Diagnosis not present

## 2014-02-19 DIAGNOSIS — R1110 Vomiting, unspecified: Secondary | ICD-10-CM | POA: Diagnosis not present

## 2014-02-19 DIAGNOSIS — K219 Gastro-esophageal reflux disease without esophagitis: Secondary | ICD-10-CM | POA: Diagnosis not present

## 2014-02-19 DIAGNOSIS — I10 Essential (primary) hypertension: Secondary | ICD-10-CM | POA: Diagnosis not present

## 2014-02-19 DIAGNOSIS — K5289 Other specified noninfective gastroenteritis and colitis: Secondary | ICD-10-CM | POA: Diagnosis not present

## 2014-02-19 DIAGNOSIS — Z79899 Other long term (current) drug therapy: Secondary | ICD-10-CM | POA: Diagnosis not present

## 2014-02-19 DIAGNOSIS — E7800 Pure hypercholesterolemia, unspecified: Secondary | ICD-10-CM | POA: Diagnosis not present

## 2014-03-10 DIAGNOSIS — R1031 Right lower quadrant pain: Secondary | ICD-10-CM | POA: Diagnosis not present

## 2014-03-10 DIAGNOSIS — N390 Urinary tract infection, site not specified: Secondary | ICD-10-CM | POA: Diagnosis not present

## 2014-03-10 DIAGNOSIS — Z6834 Body mass index (BMI) 34.0-34.9, adult: Secondary | ICD-10-CM | POA: Diagnosis not present

## 2014-03-10 DIAGNOSIS — E119 Type 2 diabetes mellitus without complications: Secondary | ICD-10-CM | POA: Diagnosis not present

## 2014-03-10 DIAGNOSIS — N135 Crossing vessel and stricture of ureter without hydronephrosis: Secondary | ICD-10-CM | POA: Diagnosis not present

## 2014-03-10 DIAGNOSIS — I10 Essential (primary) hypertension: Secondary | ICD-10-CM | POA: Diagnosis not present

## 2014-03-10 DIAGNOSIS — E785 Hyperlipidemia, unspecified: Secondary | ICD-10-CM | POA: Diagnosis not present

## 2014-03-22 DIAGNOSIS — N390 Urinary tract infection, site not specified: Secondary | ICD-10-CM | POA: Diagnosis not present

## 2014-03-22 DIAGNOSIS — Z6833 Body mass index (BMI) 33.0-33.9, adult: Secondary | ICD-10-CM | POA: Diagnosis not present

## 2014-03-22 DIAGNOSIS — IMO0001 Reserved for inherently not codable concepts without codable children: Secondary | ICD-10-CM | POA: Diagnosis not present

## 2014-04-04 DIAGNOSIS — K5900 Constipation, unspecified: Secondary | ICD-10-CM | POA: Diagnosis not present

## 2014-04-04 DIAGNOSIS — K625 Hemorrhage of anus and rectum: Secondary | ICD-10-CM | POA: Diagnosis not present

## 2014-04-04 DIAGNOSIS — Z6833 Body mass index (BMI) 33.0-33.9, adult: Secondary | ICD-10-CM | POA: Diagnosis not present

## 2014-04-29 DIAGNOSIS — F3189 Other bipolar disorder: Secondary | ICD-10-CM | POA: Diagnosis not present

## 2014-05-16 IMAGING — CT NM PET TUM IMG INITIAL (PI) SKULL BASE T - THIGH
1 of 7 series · 2 of 25 positions shown · non-contrast
Comparison: None.

CLINICAL DATA: Initial treatment strategy for cervical carcinoma.

EXAM:
NUCLEAR MEDICINE PET SKULL BASE TO THIGH
FASTING BLOOD GLUCOSE:  Value: 93 mg/dl
TECHNIQUE: 13.6 mCi F-18 FDG was injected intravenously. CT data was obtained
and used for attenuation correction and anatomic localization only.
(This was not acquired as a diagnostic CT examination.) Additional
exam technical data entered on technologist worksheet.

[Series 4: ct sk_thigh 5.0 b31f · axial · 5.0mm · 0.98mm/px · z∈[-966,-166]mm · 2 of 201 slices shown]
[im 1/201  brain]
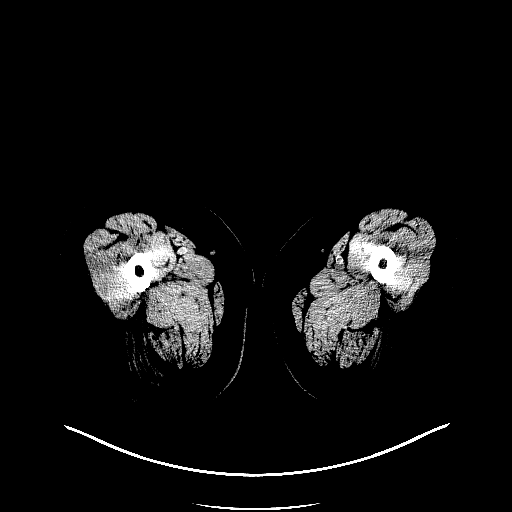
[im 201/201  brain]
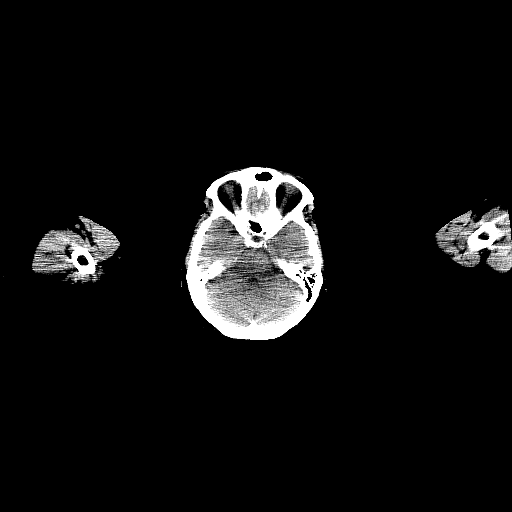

[2 of 25 positions shown; findings below may reference images not displayed]

FINDINGS: NECK

No hypermetabolic lymph nodes in the neck.

CHEST

No hypermetabolic mediastinal or hilar nodes. No suspicious
pulmonary nodules on the CT scan.

ABDOMEN/PELVIS

Post hysterectomy. No hypermetabolic pelvic lymph nodes. Small left
external iliac lymph node along the obturator fossa measures 8 mm
short axis (image 160, series 4) with SUV max 1.7. No
retroperitoneal or periaortic lymphadenopathy. No abnormal activity
within the liver. Adrenal glands are normal.

SKELETON

No focal hypermetabolic activity to suggest skeletal metastasis.
IMPRESSION: 1. No evidence of local cervical cancer metastasis.
2. No evidence of nodal metastasis.
3. Small left obturator lymph node with minimal metabolic activity
is likely benign although indeterminate.

## 2014-06-13 DIAGNOSIS — Z1231 Encounter for screening mammogram for malignant neoplasm of breast: Secondary | ICD-10-CM | POA: Diagnosis not present

## 2014-06-13 DIAGNOSIS — Z23 Encounter for immunization: Secondary | ICD-10-CM | POA: Diagnosis not present

## 2014-06-13 DIAGNOSIS — I10 Essential (primary) hypertension: Secondary | ICD-10-CM | POA: Diagnosis not present

## 2014-06-13 DIAGNOSIS — E119 Type 2 diabetes mellitus without complications: Secondary | ICD-10-CM | POA: Diagnosis not present

## 2014-06-13 DIAGNOSIS — Z0000 Encounter for general adult medical examination without abnormal findings: Secondary | ICD-10-CM | POA: Diagnosis not present

## 2014-06-13 DIAGNOSIS — E785 Hyperlipidemia, unspecified: Secondary | ICD-10-CM | POA: Diagnosis not present

## 2014-06-13 DIAGNOSIS — Z1211 Encounter for screening for malignant neoplasm of colon: Secondary | ICD-10-CM | POA: Diagnosis not present

## 2014-06-16 DIAGNOSIS — N2889 Other specified disorders of kidney and ureter: Secondary | ICD-10-CM | POA: Diagnosis not present

## 2014-06-16 DIAGNOSIS — N135 Crossing vessel and stricture of ureter without hydronephrosis: Secondary | ICD-10-CM | POA: Diagnosis not present

## 2014-06-23 DIAGNOSIS — Z1231 Encounter for screening mammogram for malignant neoplasm of breast: Secondary | ICD-10-CM | POA: Diagnosis not present

## 2014-07-06 ENCOUNTER — Telehealth: Payer: Self-pay | Admitting: *Deleted

## 2014-07-06 NOTE — Telephone Encounter (Signed)
WRONG PATIENT

## 2014-07-11 DIAGNOSIS — R509 Fever, unspecified: Secondary | ICD-10-CM | POA: Diagnosis not present

## 2014-07-11 DIAGNOSIS — Z6834 Body mass index (BMI) 34.0-34.9, adult: Secondary | ICD-10-CM | POA: Diagnosis not present

## 2014-07-11 DIAGNOSIS — B349 Viral infection, unspecified: Secondary | ICD-10-CM | POA: Diagnosis not present

## 2014-07-14 DIAGNOSIS — K5900 Constipation, unspecified: Secondary | ICD-10-CM | POA: Diagnosis not present

## 2014-07-14 DIAGNOSIS — Z1211 Encounter for screening for malignant neoplasm of colon: Secondary | ICD-10-CM | POA: Diagnosis not present

## 2014-08-15 DIAGNOSIS — Z124 Encounter for screening for malignant neoplasm of cervix: Secondary | ICD-10-CM | POA: Diagnosis not present

## 2014-08-15 DIAGNOSIS — C539 Malignant neoplasm of cervix uteri, unspecified: Secondary | ICD-10-CM | POA: Diagnosis not present

## 2014-08-19 DIAGNOSIS — F3181 Bipolar II disorder: Secondary | ICD-10-CM | POA: Diagnosis not present

## 2014-08-28 DIAGNOSIS — J069 Acute upper respiratory infection, unspecified: Secondary | ICD-10-CM | POA: Diagnosis not present

## 2014-09-15 DIAGNOSIS — Z9981 Dependence on supplemental oxygen: Secondary | ICD-10-CM | POA: Diagnosis not present

## 2014-09-15 DIAGNOSIS — K219 Gastro-esophageal reflux disease without esophagitis: Secondary | ICD-10-CM | POA: Diagnosis not present

## 2014-09-15 DIAGNOSIS — Z79899 Other long term (current) drug therapy: Secondary | ICD-10-CM | POA: Diagnosis not present

## 2014-09-15 DIAGNOSIS — Z86010 Personal history of colonic polyps: Secondary | ICD-10-CM | POA: Diagnosis not present

## 2014-09-15 DIAGNOSIS — E669 Obesity, unspecified: Secondary | ICD-10-CM | POA: Diagnosis not present

## 2014-09-15 DIAGNOSIS — E119 Type 2 diabetes mellitus without complications: Secondary | ICD-10-CM | POA: Diagnosis not present

## 2014-09-15 DIAGNOSIS — Z1211 Encounter for screening for malignant neoplasm of colon: Secondary | ICD-10-CM | POA: Diagnosis not present

## 2014-09-15 DIAGNOSIS — Z7982 Long term (current) use of aspirin: Secondary | ICD-10-CM | POA: Diagnosis not present

## 2014-09-15 DIAGNOSIS — I10 Essential (primary) hypertension: Secondary | ICD-10-CM | POA: Diagnosis not present

## 2014-09-15 DIAGNOSIS — Z6835 Body mass index (BMI) 35.0-35.9, adult: Secondary | ICD-10-CM | POA: Diagnosis not present

## 2014-09-15 DIAGNOSIS — J45909 Unspecified asthma, uncomplicated: Secondary | ICD-10-CM | POA: Diagnosis not present

## 2014-09-15 DIAGNOSIS — K5730 Diverticulosis of large intestine without perforation or abscess without bleeding: Secondary | ICD-10-CM | POA: Diagnosis not present

## 2014-09-15 DIAGNOSIS — G4733 Obstructive sleep apnea (adult) (pediatric): Secondary | ICD-10-CM | POA: Diagnosis not present

## 2014-09-15 DIAGNOSIS — Q438 Other specified congenital malformations of intestine: Secondary | ICD-10-CM | POA: Diagnosis not present

## 2014-10-19 DIAGNOSIS — Z6836 Body mass index (BMI) 36.0-36.9, adult: Secondary | ICD-10-CM | POA: Diagnosis not present

## 2014-10-19 DIAGNOSIS — E119 Type 2 diabetes mellitus without complications: Secondary | ICD-10-CM | POA: Diagnosis not present

## 2014-10-19 DIAGNOSIS — Z1389 Encounter for screening for other disorder: Secondary | ICD-10-CM | POA: Diagnosis not present

## 2014-10-19 DIAGNOSIS — E785 Hyperlipidemia, unspecified: Secondary | ICD-10-CM | POA: Diagnosis not present

## 2014-10-19 DIAGNOSIS — Z9181 History of falling: Secondary | ICD-10-CM | POA: Diagnosis not present

## 2014-10-19 DIAGNOSIS — I10 Essential (primary) hypertension: Secondary | ICD-10-CM | POA: Diagnosis not present

## 2014-12-19 DIAGNOSIS — N3289 Other specified disorders of bladder: Secondary | ICD-10-CM | POA: Diagnosis not present

## 2014-12-19 DIAGNOSIS — Z0389 Encounter for observation for other suspected diseases and conditions ruled out: Secondary | ICD-10-CM | POA: Diagnosis not present

## 2014-12-19 DIAGNOSIS — N393 Stress incontinence (female) (male): Secondary | ICD-10-CM | POA: Diagnosis not present

## 2014-12-19 DIAGNOSIS — N821 Other female urinary-genital tract fistulae: Secondary | ICD-10-CM | POA: Diagnosis not present

## 2014-12-19 DIAGNOSIS — R32 Unspecified urinary incontinence: Secondary | ICD-10-CM | POA: Diagnosis not present

## 2014-12-19 DIAGNOSIS — N2889 Other specified disorders of kidney and ureter: Secondary | ICD-10-CM | POA: Diagnosis not present

## 2014-12-19 DIAGNOSIS — Z960 Presence of urogenital implants: Secondary | ICD-10-CM | POA: Diagnosis not present

## 2014-12-19 DIAGNOSIS — R351 Nocturia: Secondary | ICD-10-CM | POA: Diagnosis not present

## 2014-12-20 DIAGNOSIS — F3181 Bipolar II disorder: Secondary | ICD-10-CM | POA: Diagnosis not present

## 2015-01-17 DIAGNOSIS — H25811 Combined forms of age-related cataract, right eye: Secondary | ICD-10-CM | POA: Diagnosis not present

## 2015-01-17 DIAGNOSIS — H3530 Unspecified macular degeneration: Secondary | ICD-10-CM | POA: Diagnosis not present

## 2015-01-18 DIAGNOSIS — R4182 Altered mental status, unspecified: Secondary | ICD-10-CM | POA: Diagnosis not present

## 2015-01-18 DIAGNOSIS — Z1389 Encounter for screening for other disorder: Secondary | ICD-10-CM | POA: Diagnosis not present

## 2015-01-18 DIAGNOSIS — Z6837 Body mass index (BMI) 37.0-37.9, adult: Secondary | ICD-10-CM | POA: Diagnosis not present

## 2015-01-18 DIAGNOSIS — I10 Essential (primary) hypertension: Secondary | ICD-10-CM | POA: Diagnosis not present

## 2015-01-18 DIAGNOSIS — R51 Headache: Secondary | ICD-10-CM | POA: Diagnosis not present

## 2015-01-18 DIAGNOSIS — C539 Malignant neoplasm of cervix uteri, unspecified: Secondary | ICD-10-CM | POA: Diagnosis not present

## 2015-01-18 DIAGNOSIS — E119 Type 2 diabetes mellitus without complications: Secondary | ICD-10-CM | POA: Diagnosis not present

## 2015-01-24 DIAGNOSIS — R51 Headache: Secondary | ICD-10-CM | POA: Diagnosis not present

## 2015-01-24 DIAGNOSIS — Z6838 Body mass index (BMI) 38.0-38.9, adult: Secondary | ICD-10-CM | POA: Diagnosis not present

## 2015-01-27 DIAGNOSIS — I739 Peripheral vascular disease, unspecified: Secondary | ICD-10-CM | POA: Diagnosis not present

## 2015-01-27 DIAGNOSIS — J329 Chronic sinusitis, unspecified: Secondary | ICD-10-CM | POA: Diagnosis not present

## 2015-01-27 DIAGNOSIS — R51 Headache: Secondary | ICD-10-CM | POA: Diagnosis not present

## 2015-01-31 DIAGNOSIS — E119 Type 2 diabetes mellitus without complications: Secondary | ICD-10-CM | POA: Diagnosis not present

## 2015-01-31 DIAGNOSIS — J45909 Unspecified asthma, uncomplicated: Secondary | ICD-10-CM | POA: Diagnosis not present

## 2015-01-31 DIAGNOSIS — H259 Unspecified age-related cataract: Secondary | ICD-10-CM | POA: Diagnosis not present

## 2015-01-31 DIAGNOSIS — Z7982 Long term (current) use of aspirin: Secondary | ICD-10-CM | POA: Diagnosis not present

## 2015-01-31 DIAGNOSIS — H25811 Combined forms of age-related cataract, right eye: Secondary | ICD-10-CM | POA: Diagnosis not present

## 2015-01-31 DIAGNOSIS — I10 Essential (primary) hypertension: Secondary | ICD-10-CM | POA: Diagnosis not present

## 2015-01-31 DIAGNOSIS — E785 Hyperlipidemia, unspecified: Secondary | ICD-10-CM | POA: Diagnosis not present

## 2015-01-31 DIAGNOSIS — Z79899 Other long term (current) drug therapy: Secondary | ICD-10-CM | POA: Diagnosis not present

## 2015-01-31 DIAGNOSIS — G4733 Obstructive sleep apnea (adult) (pediatric): Secondary | ICD-10-CM | POA: Diagnosis not present

## 2015-01-31 DIAGNOSIS — Z9981 Dependence on supplemental oxygen: Secondary | ICD-10-CM | POA: Diagnosis not present

## 2015-02-14 DIAGNOSIS — R51 Headache: Secondary | ICD-10-CM | POA: Diagnosis not present

## 2015-02-14 DIAGNOSIS — Z6837 Body mass index (BMI) 37.0-37.9, adult: Secondary | ICD-10-CM | POA: Diagnosis not present

## 2015-02-14 DIAGNOSIS — M5382 Other specified dorsopathies, cervical region: Secondary | ICD-10-CM | POA: Diagnosis not present

## 2015-02-20 DIAGNOSIS — R51 Headache: Secondary | ICD-10-CM | POA: Diagnosis not present

## 2015-02-20 DIAGNOSIS — M5382 Other specified dorsopathies, cervical region: Secondary | ICD-10-CM | POA: Diagnosis not present

## 2015-02-20 DIAGNOSIS — E119 Type 2 diabetes mellitus without complications: Secondary | ICD-10-CM | POA: Diagnosis not present

## 2015-02-20 DIAGNOSIS — I10 Essential (primary) hypertension: Secondary | ICD-10-CM | POA: Diagnosis not present

## 2015-02-20 DIAGNOSIS — Z6837 Body mass index (BMI) 37.0-37.9, adult: Secondary | ICD-10-CM | POA: Diagnosis not present

## 2015-02-22 DIAGNOSIS — R51 Headache: Secondary | ICD-10-CM | POA: Diagnosis not present

## 2015-02-22 DIAGNOSIS — Z6837 Body mass index (BMI) 37.0-37.9, adult: Secondary | ICD-10-CM | POA: Diagnosis not present

## 2015-02-22 DIAGNOSIS — E119 Type 2 diabetes mellitus without complications: Secondary | ICD-10-CM | POA: Diagnosis not present

## 2015-02-22 DIAGNOSIS — M5382 Other specified dorsopathies, cervical region: Secondary | ICD-10-CM | POA: Diagnosis not present

## 2015-02-22 DIAGNOSIS — I10 Essential (primary) hypertension: Secondary | ICD-10-CM | POA: Diagnosis not present

## 2015-02-24 DIAGNOSIS — E119 Type 2 diabetes mellitus without complications: Secondary | ICD-10-CM | POA: Diagnosis not present

## 2015-02-24 DIAGNOSIS — M5382 Other specified dorsopathies, cervical region: Secondary | ICD-10-CM | POA: Diagnosis not present

## 2015-02-24 DIAGNOSIS — Z6837 Body mass index (BMI) 37.0-37.9, adult: Secondary | ICD-10-CM | POA: Diagnosis not present

## 2015-02-24 DIAGNOSIS — R51 Headache: Secondary | ICD-10-CM | POA: Diagnosis not present

## 2015-02-24 DIAGNOSIS — I10 Essential (primary) hypertension: Secondary | ICD-10-CM | POA: Diagnosis not present

## 2015-02-27 DIAGNOSIS — C539 Malignant neoplasm of cervix uteri, unspecified: Secondary | ICD-10-CM | POA: Diagnosis not present

## 2015-02-28 DIAGNOSIS — R51 Headache: Secondary | ICD-10-CM | POA: Diagnosis not present

## 2015-02-28 DIAGNOSIS — Z6837 Body mass index (BMI) 37.0-37.9, adult: Secondary | ICD-10-CM | POA: Diagnosis not present

## 2015-02-28 DIAGNOSIS — E119 Type 2 diabetes mellitus without complications: Secondary | ICD-10-CM | POA: Diagnosis not present

## 2015-02-28 DIAGNOSIS — I10 Essential (primary) hypertension: Secondary | ICD-10-CM | POA: Diagnosis not present

## 2015-02-28 DIAGNOSIS — M5382 Other specified dorsopathies, cervical region: Secondary | ICD-10-CM | POA: Diagnosis not present

## 2015-03-01 DIAGNOSIS — I10 Essential (primary) hypertension: Secondary | ICD-10-CM | POA: Diagnosis not present

## 2015-03-01 DIAGNOSIS — E119 Type 2 diabetes mellitus without complications: Secondary | ICD-10-CM | POA: Diagnosis not present

## 2015-03-01 DIAGNOSIS — Z6837 Body mass index (BMI) 37.0-37.9, adult: Secondary | ICD-10-CM | POA: Diagnosis not present

## 2015-03-01 DIAGNOSIS — R51 Headache: Secondary | ICD-10-CM | POA: Diagnosis not present

## 2015-03-01 DIAGNOSIS — M5382 Other specified dorsopathies, cervical region: Secondary | ICD-10-CM | POA: Diagnosis not present

## 2015-03-02 DIAGNOSIS — Z6837 Body mass index (BMI) 37.0-37.9, adult: Secondary | ICD-10-CM | POA: Diagnosis not present

## 2015-03-02 DIAGNOSIS — M5382 Other specified dorsopathies, cervical region: Secondary | ICD-10-CM | POA: Diagnosis not present

## 2015-03-02 DIAGNOSIS — I10 Essential (primary) hypertension: Secondary | ICD-10-CM | POA: Diagnosis not present

## 2015-03-02 DIAGNOSIS — R51 Headache: Secondary | ICD-10-CM | POA: Diagnosis not present

## 2015-03-02 DIAGNOSIS — E119 Type 2 diabetes mellitus without complications: Secondary | ICD-10-CM | POA: Diagnosis not present

## 2015-03-13 DIAGNOSIS — M5382 Other specified dorsopathies, cervical region: Secondary | ICD-10-CM | POA: Diagnosis not present

## 2015-03-13 DIAGNOSIS — M2560 Stiffness of unspecified joint, not elsewhere classified: Secondary | ICD-10-CM | POA: Diagnosis not present

## 2015-03-20 DIAGNOSIS — M5382 Other specified dorsopathies, cervical region: Secondary | ICD-10-CM | POA: Diagnosis not present

## 2015-03-20 DIAGNOSIS — M2560 Stiffness of unspecified joint, not elsewhere classified: Secondary | ICD-10-CM | POA: Diagnosis not present

## 2015-03-21 DIAGNOSIS — F3181 Bipolar II disorder: Secondary | ICD-10-CM | POA: Diagnosis not present

## 2015-03-22 DIAGNOSIS — M5032 Other cervical disc degeneration, mid-cervical region: Secondary | ICD-10-CM | POA: Diagnosis not present

## 2015-03-22 DIAGNOSIS — M5382 Other specified dorsopathies, cervical region: Secondary | ICD-10-CM | POA: Diagnosis not present

## 2015-03-22 DIAGNOSIS — M4040 Postural lordosis, site unspecified: Secondary | ICD-10-CM | POA: Diagnosis not present

## 2015-03-24 DIAGNOSIS — M5382 Other specified dorsopathies, cervical region: Secondary | ICD-10-CM | POA: Diagnosis not present

## 2015-03-24 DIAGNOSIS — M2560 Stiffness of unspecified joint, not elsewhere classified: Secondary | ICD-10-CM | POA: Diagnosis not present

## 2015-03-27 DIAGNOSIS — Z6838 Body mass index (BMI) 38.0-38.9, adult: Secondary | ICD-10-CM | POA: Diagnosis not present

## 2015-03-27 DIAGNOSIS — M2560 Stiffness of unspecified joint, not elsewhere classified: Secondary | ICD-10-CM | POA: Diagnosis not present

## 2015-03-27 DIAGNOSIS — G4733 Obstructive sleep apnea (adult) (pediatric): Secondary | ICD-10-CM | POA: Diagnosis not present

## 2015-03-27 DIAGNOSIS — M5382 Other specified dorsopathies, cervical region: Secondary | ICD-10-CM | POA: Diagnosis not present

## 2015-03-28 DIAGNOSIS — Z79899 Other long term (current) drug therapy: Secondary | ICD-10-CM | POA: Diagnosis not present

## 2015-03-28 DIAGNOSIS — H259 Unspecified age-related cataract: Secondary | ICD-10-CM | POA: Diagnosis not present

## 2015-03-28 DIAGNOSIS — K219 Gastro-esophageal reflux disease without esophagitis: Secondary | ICD-10-CM | POA: Diagnosis not present

## 2015-03-28 DIAGNOSIS — H25812 Combined forms of age-related cataract, left eye: Secondary | ICD-10-CM | POA: Diagnosis not present

## 2015-03-28 DIAGNOSIS — Z7982 Long term (current) use of aspirin: Secondary | ICD-10-CM | POA: Diagnosis not present

## 2015-03-28 DIAGNOSIS — J45909 Unspecified asthma, uncomplicated: Secondary | ICD-10-CM | POA: Diagnosis not present

## 2015-03-28 DIAGNOSIS — G4733 Obstructive sleep apnea (adult) (pediatric): Secondary | ICD-10-CM | POA: Diagnosis not present

## 2015-03-28 DIAGNOSIS — E119 Type 2 diabetes mellitus without complications: Secondary | ICD-10-CM | POA: Diagnosis not present

## 2015-03-28 DIAGNOSIS — I10 Essential (primary) hypertension: Secondary | ICD-10-CM | POA: Diagnosis not present

## 2015-03-28 DIAGNOSIS — Z9981 Dependence on supplemental oxygen: Secondary | ICD-10-CM | POA: Diagnosis not present

## 2015-03-30 DIAGNOSIS — M2560 Stiffness of unspecified joint, not elsewhere classified: Secondary | ICD-10-CM | POA: Diagnosis not present

## 2015-03-30 DIAGNOSIS — M5382 Other specified dorsopathies, cervical region: Secondary | ICD-10-CM | POA: Diagnosis not present

## 2015-04-03 DIAGNOSIS — M2560 Stiffness of unspecified joint, not elsewhere classified: Secondary | ICD-10-CM | POA: Diagnosis not present

## 2015-04-03 DIAGNOSIS — M5382 Other specified dorsopathies, cervical region: Secondary | ICD-10-CM | POA: Diagnosis not present

## 2015-04-06 DIAGNOSIS — M5382 Other specified dorsopathies, cervical region: Secondary | ICD-10-CM | POA: Diagnosis not present

## 2015-04-06 DIAGNOSIS — M2560 Stiffness of unspecified joint, not elsewhere classified: Secondary | ICD-10-CM | POA: Diagnosis not present

## 2015-04-13 DIAGNOSIS — S60417A Abrasion of left little finger, initial encounter: Secondary | ICD-10-CM | POA: Diagnosis not present

## 2015-04-17 DIAGNOSIS — M2560 Stiffness of unspecified joint, not elsewhere classified: Secondary | ICD-10-CM | POA: Diagnosis not present

## 2015-04-17 DIAGNOSIS — L03211 Cellulitis of face: Secondary | ICD-10-CM | POA: Diagnosis not present

## 2015-04-17 DIAGNOSIS — Z6839 Body mass index (BMI) 39.0-39.9, adult: Secondary | ICD-10-CM | POA: Diagnosis not present

## 2015-04-17 DIAGNOSIS — M5382 Other specified dorsopathies, cervical region: Secondary | ICD-10-CM | POA: Diagnosis not present

## 2015-04-20 DIAGNOSIS — M2560 Stiffness of unspecified joint, not elsewhere classified: Secondary | ICD-10-CM | POA: Diagnosis not present

## 2015-04-20 DIAGNOSIS — M5382 Other specified dorsopathies, cervical region: Secondary | ICD-10-CM | POA: Diagnosis not present

## 2015-04-26 DIAGNOSIS — E119 Type 2 diabetes mellitus without complications: Secondary | ICD-10-CM | POA: Diagnosis not present

## 2015-04-26 DIAGNOSIS — Z9181 History of falling: Secondary | ICD-10-CM | POA: Diagnosis not present

## 2015-04-26 DIAGNOSIS — E785 Hyperlipidemia, unspecified: Secondary | ICD-10-CM | POA: Diagnosis not present

## 2015-04-26 DIAGNOSIS — Z6839 Body mass index (BMI) 39.0-39.9, adult: Secondary | ICD-10-CM | POA: Diagnosis not present

## 2015-06-12 DIAGNOSIS — Z23 Encounter for immunization: Secondary | ICD-10-CM | POA: Diagnosis not present

## 2015-06-13 DIAGNOSIS — F3181 Bipolar II disorder: Secondary | ICD-10-CM | POA: Diagnosis not present

## 2015-07-31 DIAGNOSIS — F3181 Bipolar II disorder: Secondary | ICD-10-CM | POA: Diagnosis not present

## 2015-08-02 DIAGNOSIS — I10 Essential (primary) hypertension: Secondary | ICD-10-CM | POA: Diagnosis not present

## 2015-08-02 DIAGNOSIS — Z6839 Body mass index (BMI) 39.0-39.9, adult: Secondary | ICD-10-CM | POA: Diagnosis not present

## 2015-08-02 DIAGNOSIS — Z1389 Encounter for screening for other disorder: Secondary | ICD-10-CM | POA: Diagnosis not present

## 2015-08-02 DIAGNOSIS — E119 Type 2 diabetes mellitus without complications: Secondary | ICD-10-CM | POA: Diagnosis not present

## 2015-08-02 DIAGNOSIS — Z23 Encounter for immunization: Secondary | ICD-10-CM | POA: Diagnosis not present

## 2015-08-02 DIAGNOSIS — E785 Hyperlipidemia, unspecified: Secondary | ICD-10-CM | POA: Diagnosis not present

## 2015-08-02 DIAGNOSIS — Z9181 History of falling: Secondary | ICD-10-CM | POA: Diagnosis not present

## 2015-08-04 DIAGNOSIS — Z1231 Encounter for screening mammogram for malignant neoplasm of breast: Secondary | ICD-10-CM | POA: Diagnosis not present

## 2015-08-29 DIAGNOSIS — Z6839 Body mass index (BMI) 39.0-39.9, adult: Secondary | ICD-10-CM | POA: Diagnosis not present

## 2015-08-29 DIAGNOSIS — Z1389 Encounter for screening for other disorder: Secondary | ICD-10-CM | POA: Diagnosis not present

## 2015-08-29 DIAGNOSIS — Z0000 Encounter for general adult medical examination without abnormal findings: Secondary | ICD-10-CM | POA: Diagnosis not present

## 2015-08-29 DIAGNOSIS — E119 Type 2 diabetes mellitus without complications: Secondary | ICD-10-CM | POA: Diagnosis not present

## 2015-08-29 DIAGNOSIS — R000 Tachycardia, unspecified: Secondary | ICD-10-CM | POA: Diagnosis not present

## 2015-10-23 DIAGNOSIS — Z882 Allergy status to sulfonamides status: Secondary | ICD-10-CM | POA: Diagnosis not present

## 2015-10-23 DIAGNOSIS — C539 Malignant neoplasm of cervix uteri, unspecified: Secondary | ICD-10-CM | POA: Diagnosis not present

## 2015-10-23 DIAGNOSIS — Z8541 Personal history of malignant neoplasm of cervix uteri: Secondary | ICD-10-CM | POA: Diagnosis not present

## 2015-10-23 DIAGNOSIS — Z08 Encounter for follow-up examination after completed treatment for malignant neoplasm: Secondary | ICD-10-CM | POA: Diagnosis not present

## 2015-10-23 DIAGNOSIS — R32 Unspecified urinary incontinence: Secondary | ICD-10-CM | POA: Diagnosis not present

## 2015-10-31 DIAGNOSIS — F3181 Bipolar II disorder: Secondary | ICD-10-CM | POA: Diagnosis not present

## 2015-11-06 DIAGNOSIS — E669 Obesity, unspecified: Secondary | ICD-10-CM | POA: Diagnosis not present

## 2015-11-06 DIAGNOSIS — E119 Type 2 diabetes mellitus without complications: Secondary | ICD-10-CM | POA: Diagnosis not present

## 2015-11-06 DIAGNOSIS — G4733 Obstructive sleep apnea (adult) (pediatric): Secondary | ICD-10-CM | POA: Diagnosis not present

## 2015-11-06 DIAGNOSIS — E785 Hyperlipidemia, unspecified: Secondary | ICD-10-CM | POA: Diagnosis not present

## 2015-11-06 DIAGNOSIS — F99 Mental disorder, not otherwise specified: Secondary | ICD-10-CM | POA: Diagnosis not present

## 2015-11-06 DIAGNOSIS — I10 Essential (primary) hypertension: Secondary | ICD-10-CM | POA: Diagnosis not present

## 2015-11-20 DIAGNOSIS — Z6838 Body mass index (BMI) 38.0-38.9, adult: Secondary | ICD-10-CM | POA: Diagnosis not present

## 2015-11-20 DIAGNOSIS — J020 Streptococcal pharyngitis: Secondary | ICD-10-CM | POA: Diagnosis not present

## 2016-01-18 DIAGNOSIS — J019 Acute sinusitis, unspecified: Secondary | ICD-10-CM | POA: Diagnosis not present

## 2016-01-18 DIAGNOSIS — Z6838 Body mass index (BMI) 38.0-38.9, adult: Secondary | ICD-10-CM | POA: Diagnosis not present

## 2016-02-12 DIAGNOSIS — E669 Obesity, unspecified: Secondary | ICD-10-CM | POA: Diagnosis not present

## 2016-02-12 DIAGNOSIS — Z6837 Body mass index (BMI) 37.0-37.9, adult: Secondary | ICD-10-CM | POA: Diagnosis not present

## 2016-02-12 DIAGNOSIS — E119 Type 2 diabetes mellitus without complications: Secondary | ICD-10-CM | POA: Diagnosis not present

## 2016-02-12 DIAGNOSIS — G4733 Obstructive sleep apnea (adult) (pediatric): Secondary | ICD-10-CM | POA: Diagnosis not present

## 2016-02-12 DIAGNOSIS — F99 Mental disorder, not otherwise specified: Secondary | ICD-10-CM | POA: Diagnosis not present

## 2016-02-12 DIAGNOSIS — I10 Essential (primary) hypertension: Secondary | ICD-10-CM | POA: Diagnosis not present

## 2016-02-12 DIAGNOSIS — M545 Low back pain: Secondary | ICD-10-CM | POA: Diagnosis not present

## 2016-02-12 DIAGNOSIS — E785 Hyperlipidemia, unspecified: Secondary | ICD-10-CM | POA: Diagnosis not present

## 2016-02-12 DIAGNOSIS — R300 Dysuria: Secondary | ICD-10-CM | POA: Diagnosis not present

## 2016-02-28 DIAGNOSIS — F3181 Bipolar II disorder: Secondary | ICD-10-CM | POA: Diagnosis not present

## 2016-03-12 DIAGNOSIS — Z6838 Body mass index (BMI) 38.0-38.9, adult: Secondary | ICD-10-CM | POA: Diagnosis not present

## 2016-03-12 DIAGNOSIS — R1084 Generalized abdominal pain: Secondary | ICD-10-CM | POA: Diagnosis not present

## 2016-03-14 DIAGNOSIS — H353130 Nonexudative age-related macular degeneration, bilateral, stage unspecified: Secondary | ICD-10-CM | POA: Diagnosis not present

## 2016-03-26 DIAGNOSIS — I10 Essential (primary) hypertension: Secondary | ICD-10-CM | POA: Diagnosis not present

## 2016-03-26 DIAGNOSIS — N9089 Other specified noninflammatory disorders of vulva and perineum: Secondary | ICD-10-CM | POA: Diagnosis not present

## 2016-04-04 DIAGNOSIS — H353131 Nonexudative age-related macular degeneration, bilateral, early dry stage: Secondary | ICD-10-CM | POA: Diagnosis not present

## 2016-04-05 DIAGNOSIS — R1084 Generalized abdominal pain: Secondary | ICD-10-CM | POA: Diagnosis not present

## 2016-04-09 DIAGNOSIS — K760 Fatty (change of) liver, not elsewhere classified: Secondary | ICD-10-CM | POA: Diagnosis not present

## 2016-04-09 DIAGNOSIS — R1084 Generalized abdominal pain: Secondary | ICD-10-CM | POA: Diagnosis not present

## 2016-04-10 DIAGNOSIS — R000 Tachycardia, unspecified: Secondary | ICD-10-CM | POA: Diagnosis not present

## 2016-04-10 DIAGNOSIS — E7800 Pure hypercholesterolemia, unspecified: Secondary | ICD-10-CM | POA: Diagnosis not present

## 2016-04-10 DIAGNOSIS — R1084 Generalized abdominal pain: Secondary | ICD-10-CM | POA: Diagnosis not present

## 2016-04-10 DIAGNOSIS — R109 Unspecified abdominal pain: Secondary | ICD-10-CM | POA: Diagnosis not present

## 2016-04-10 DIAGNOSIS — I10 Essential (primary) hypertension: Secondary | ICD-10-CM | POA: Diagnosis not present

## 2016-04-10 DIAGNOSIS — M791 Myalgia: Secondary | ICD-10-CM | POA: Diagnosis not present

## 2016-04-11 DIAGNOSIS — R110 Nausea: Secondary | ICD-10-CM | POA: Diagnosis not present

## 2016-04-14 DIAGNOSIS — F329 Major depressive disorder, single episode, unspecified: Secondary | ICD-10-CM | POA: Diagnosis not present

## 2016-04-14 DIAGNOSIS — K5790 Diverticulosis of intestine, part unspecified, without perforation or abscess without bleeding: Secondary | ICD-10-CM | POA: Diagnosis not present

## 2016-04-14 DIAGNOSIS — K219 Gastro-esophageal reflux disease without esophagitis: Secondary | ICD-10-CM | POA: Diagnosis not present

## 2016-04-14 DIAGNOSIS — Z7984 Long term (current) use of oral hypoglycemic drugs: Secondary | ICD-10-CM | POA: Diagnosis not present

## 2016-04-14 DIAGNOSIS — I110 Hypertensive heart disease with heart failure: Secondary | ICD-10-CM | POA: Diagnosis not present

## 2016-04-14 DIAGNOSIS — Z7982 Long term (current) use of aspirin: Secondary | ICD-10-CM | POA: Diagnosis not present

## 2016-04-14 DIAGNOSIS — Z79899 Other long term (current) drug therapy: Secondary | ICD-10-CM | POA: Diagnosis not present

## 2016-04-14 DIAGNOSIS — E7800 Pure hypercholesterolemia, unspecified: Secondary | ICD-10-CM | POA: Diagnosis not present

## 2016-04-14 DIAGNOSIS — R1013 Epigastric pain: Secondary | ICD-10-CM | POA: Diagnosis not present

## 2016-04-14 DIAGNOSIS — I509 Heart failure, unspecified: Secondary | ICD-10-CM

## 2016-04-14 DIAGNOSIS — E785 Hyperlipidemia, unspecified: Secondary | ICD-10-CM | POA: Diagnosis not present

## 2016-04-14 DIAGNOSIS — I200 Unstable angina: Secondary | ICD-10-CM | POA: Diagnosis not present

## 2016-04-14 DIAGNOSIS — J45909 Unspecified asthma, uncomplicated: Secondary | ICD-10-CM | POA: Diagnosis not present

## 2016-04-14 DIAGNOSIS — R079 Chest pain, unspecified: Secondary | ICD-10-CM | POA: Diagnosis not present

## 2016-04-14 DIAGNOSIS — E119 Type 2 diabetes mellitus without complications: Secondary | ICD-10-CM | POA: Diagnosis not present

## 2016-04-14 DIAGNOSIS — I255 Ischemic cardiomyopathy: Secondary | ICD-10-CM | POA: Diagnosis not present

## 2016-04-14 DIAGNOSIS — M791 Myalgia: Secondary | ICD-10-CM

## 2016-04-14 DIAGNOSIS — I10 Essential (primary) hypertension: Secondary | ICD-10-CM | POA: Diagnosis not present

## 2016-04-14 DIAGNOSIS — R0602 Shortness of breath: Secondary | ICD-10-CM | POA: Diagnosis not present

## 2016-04-14 DIAGNOSIS — I5021 Acute systolic (congestive) heart failure: Secondary | ICD-10-CM | POA: Diagnosis not present

## 2016-04-15 DIAGNOSIS — F17211 Nicotine dependence, cigarettes, in remission: Secondary | ICD-10-CM | POA: Diagnosis not present

## 2016-04-15 DIAGNOSIS — I509 Heart failure, unspecified: Secondary | ICD-10-CM | POA: Diagnosis not present

## 2016-04-15 DIAGNOSIS — I493 Ventricular premature depolarization: Secondary | ICD-10-CM | POA: Diagnosis not present

## 2016-04-15 DIAGNOSIS — I517 Cardiomegaly: Secondary | ICD-10-CM | POA: Diagnosis not present

## 2016-04-15 DIAGNOSIS — I350 Nonrheumatic aortic (valve) stenosis: Secondary | ICD-10-CM | POA: Diagnosis not present

## 2016-04-15 DIAGNOSIS — E119 Type 2 diabetes mellitus without complications: Secondary | ICD-10-CM | POA: Diagnosis not present

## 2016-04-15 DIAGNOSIS — I119 Hypertensive heart disease without heart failure: Secondary | ICD-10-CM | POA: Diagnosis not present

## 2016-04-15 DIAGNOSIS — I428 Other cardiomyopathies: Secondary | ICD-10-CM | POA: Diagnosis not present

## 2016-04-15 DIAGNOSIS — I5021 Acute systolic (congestive) heart failure: Secondary | ICD-10-CM | POA: Diagnosis not present

## 2016-04-16 DIAGNOSIS — I119 Hypertensive heart disease without heart failure: Secondary | ICD-10-CM | POA: Diagnosis not present

## 2016-04-16 DIAGNOSIS — I5021 Acute systolic (congestive) heart failure: Secondary | ICD-10-CM | POA: Diagnosis not present

## 2016-04-16 DIAGNOSIS — I429 Cardiomyopathy, unspecified: Secondary | ICD-10-CM | POA: Diagnosis not present

## 2016-04-17 DIAGNOSIS — M791 Myalgia: Secondary | ICD-10-CM

## 2016-04-17 DIAGNOSIS — I509 Heart failure, unspecified: Secondary | ICD-10-CM

## 2016-04-17 DIAGNOSIS — I493 Ventricular premature depolarization: Secondary | ICD-10-CM | POA: Diagnosis not present

## 2016-04-17 DIAGNOSIS — I429 Cardiomyopathy, unspecified: Secondary | ICD-10-CM | POA: Diagnosis not present

## 2016-04-17 DIAGNOSIS — R0602 Shortness of breath: Secondary | ICD-10-CM | POA: Diagnosis not present

## 2016-04-17 DIAGNOSIS — I428 Other cardiomyopathies: Secondary | ICD-10-CM | POA: Diagnosis not present

## 2016-04-17 DIAGNOSIS — I2510 Atherosclerotic heart disease of native coronary artery without angina pectoris: Secondary | ICD-10-CM | POA: Diagnosis not present

## 2016-04-17 DIAGNOSIS — I119 Hypertensive heart disease without heart failure: Secondary | ICD-10-CM | POA: Diagnosis not present

## 2016-04-17 DIAGNOSIS — E119 Type 2 diabetes mellitus without complications: Secondary | ICD-10-CM | POA: Diagnosis not present

## 2016-04-17 DIAGNOSIS — I272 Other secondary pulmonary hypertension: Secondary | ICD-10-CM | POA: Diagnosis not present

## 2016-04-17 DIAGNOSIS — R1013 Epigastric pain: Secondary | ICD-10-CM | POA: Diagnosis not present

## 2016-04-17 DIAGNOSIS — I10 Essential (primary) hypertension: Secondary | ICD-10-CM | POA: Diagnosis not present

## 2016-04-17 DIAGNOSIS — I5021 Acute systolic (congestive) heart failure: Secondary | ICD-10-CM | POA: Diagnosis not present

## 2016-04-18 DIAGNOSIS — I2510 Atherosclerotic heart disease of native coronary artery without angina pectoris: Secondary | ICD-10-CM | POA: Diagnosis not present

## 2016-04-18 DIAGNOSIS — Z79899 Other long term (current) drug therapy: Secondary | ICD-10-CM | POA: Diagnosis not present

## 2016-04-18 DIAGNOSIS — F329 Major depressive disorder, single episode, unspecified: Secondary | ICD-10-CM | POA: Diagnosis present

## 2016-04-18 DIAGNOSIS — E7800 Pure hypercholesterolemia, unspecified: Secondary | ICD-10-CM | POA: Diagnosis present

## 2016-04-18 DIAGNOSIS — R1013 Epigastric pain: Secondary | ICD-10-CM | POA: Diagnosis not present

## 2016-04-18 DIAGNOSIS — K219 Gastro-esophageal reflux disease without esophagitis: Secondary | ICD-10-CM | POA: Diagnosis present

## 2016-04-18 DIAGNOSIS — E785 Hyperlipidemia, unspecified: Secondary | ICD-10-CM | POA: Diagnosis present

## 2016-04-18 DIAGNOSIS — Z7982 Long term (current) use of aspirin: Secondary | ICD-10-CM | POA: Diagnosis not present

## 2016-04-18 DIAGNOSIS — I110 Hypertensive heart disease with heart failure: Secondary | ICD-10-CM | POA: Diagnosis not present

## 2016-04-18 DIAGNOSIS — E119 Type 2 diabetes mellitus without complications: Secondary | ICD-10-CM | POA: Diagnosis not present

## 2016-04-18 DIAGNOSIS — I429 Cardiomyopathy, unspecified: Secondary | ICD-10-CM | POA: Diagnosis present

## 2016-04-18 DIAGNOSIS — G4730 Sleep apnea, unspecified: Secondary | ICD-10-CM | POA: Diagnosis present

## 2016-04-18 DIAGNOSIS — Z882 Allergy status to sulfonamides status: Secondary | ICD-10-CM | POA: Diagnosis not present

## 2016-04-18 DIAGNOSIS — R0602 Shortness of breath: Secondary | ICD-10-CM | POA: Diagnosis not present

## 2016-04-18 DIAGNOSIS — I340 Nonrheumatic mitral (valve) insufficiency: Secondary | ICD-10-CM | POA: Diagnosis not present

## 2016-04-18 DIAGNOSIS — E784 Other hyperlipidemia: Secondary | ICD-10-CM | POA: Diagnosis not present

## 2016-04-18 DIAGNOSIS — J45909 Unspecified asthma, uncomplicated: Secondary | ICD-10-CM | POA: Diagnosis present

## 2016-04-18 DIAGNOSIS — I5020 Unspecified systolic (congestive) heart failure: Secondary | ICD-10-CM | POA: Diagnosis present

## 2016-04-18 DIAGNOSIS — I493 Ventricular premature depolarization: Secondary | ICD-10-CM | POA: Diagnosis present

## 2016-04-18 DIAGNOSIS — I10 Essential (primary) hypertension: Secondary | ICD-10-CM | POA: Diagnosis not present

## 2016-04-18 DIAGNOSIS — I509 Heart failure, unspecified: Secondary | ICD-10-CM | POA: Diagnosis not present

## 2016-04-18 DIAGNOSIS — G4733 Obstructive sleep apnea (adult) (pediatric): Secondary | ICD-10-CM | POA: Diagnosis not present

## 2016-04-18 DIAGNOSIS — I428 Other cardiomyopathies: Secondary | ICD-10-CM | POA: Diagnosis not present

## 2016-04-19 DIAGNOSIS — R0602 Shortness of breath: Secondary | ICD-10-CM | POA: Diagnosis not present

## 2016-04-19 DIAGNOSIS — R1013 Epigastric pain: Secondary | ICD-10-CM | POA: Diagnosis not present

## 2016-04-19 DIAGNOSIS — E119 Type 2 diabetes mellitus without complications: Secondary | ICD-10-CM | POA: Diagnosis not present

## 2016-04-19 DIAGNOSIS — I10 Essential (primary) hypertension: Secondary | ICD-10-CM | POA: Diagnosis not present

## 2016-04-23 DIAGNOSIS — Z9181 History of falling: Secondary | ICD-10-CM | POA: Diagnosis not present

## 2016-04-23 DIAGNOSIS — I2510 Atherosclerotic heart disease of native coronary artery without angina pectoris: Secondary | ICD-10-CM | POA: Diagnosis not present

## 2016-04-23 DIAGNOSIS — I110 Hypertensive heart disease with heart failure: Secondary | ICD-10-CM | POA: Diagnosis not present

## 2016-04-23 DIAGNOSIS — J45909 Unspecified asthma, uncomplicated: Secondary | ICD-10-CM | POA: Diagnosis not present

## 2016-04-23 DIAGNOSIS — Z7982 Long term (current) use of aspirin: Secondary | ICD-10-CM | POA: Diagnosis not present

## 2016-04-23 DIAGNOSIS — I5021 Acute systolic (congestive) heart failure: Secondary | ICD-10-CM | POA: Diagnosis not present

## 2016-04-23 DIAGNOSIS — E119 Type 2 diabetes mellitus without complications: Secondary | ICD-10-CM | POA: Diagnosis not present

## 2016-04-23 DIAGNOSIS — F329 Major depressive disorder, single episode, unspecified: Secondary | ICD-10-CM | POA: Diagnosis not present

## 2016-04-23 DIAGNOSIS — Z9981 Dependence on supplemental oxygen: Secondary | ICD-10-CM | POA: Diagnosis not present

## 2016-04-23 DIAGNOSIS — Z7984 Long term (current) use of oral hypoglycemic drugs: Secondary | ICD-10-CM | POA: Diagnosis not present

## 2016-04-25 DIAGNOSIS — Z79899 Other long term (current) drug therapy: Secondary | ICD-10-CM | POA: Diagnosis not present

## 2016-04-25 DIAGNOSIS — I509 Heart failure, unspecified: Secondary | ICD-10-CM | POA: Diagnosis not present

## 2016-04-25 DIAGNOSIS — R1084 Generalized abdominal pain: Secondary | ICD-10-CM | POA: Diagnosis not present

## 2016-04-25 DIAGNOSIS — I10 Essential (primary) hypertension: Secondary | ICD-10-CM | POA: Diagnosis not present

## 2016-04-25 DIAGNOSIS — I7091 Generalized atherosclerosis: Secondary | ICD-10-CM | POA: Diagnosis not present

## 2016-04-25 DIAGNOSIS — I340 Nonrheumatic mitral (valve) insufficiency: Secondary | ICD-10-CM | POA: Diagnosis not present

## 2016-04-25 DIAGNOSIS — Z6836 Body mass index (BMI) 36.0-36.9, adult: Secondary | ICD-10-CM | POA: Diagnosis not present

## 2016-04-26 DIAGNOSIS — F329 Major depressive disorder, single episode, unspecified: Secondary | ICD-10-CM | POA: Diagnosis not present

## 2016-04-26 DIAGNOSIS — J45909 Unspecified asthma, uncomplicated: Secondary | ICD-10-CM | POA: Diagnosis not present

## 2016-04-26 DIAGNOSIS — I110 Hypertensive heart disease with heart failure: Secondary | ICD-10-CM | POA: Diagnosis not present

## 2016-04-26 DIAGNOSIS — E119 Type 2 diabetes mellitus without complications: Secondary | ICD-10-CM | POA: Diagnosis not present

## 2016-04-26 DIAGNOSIS — I5021 Acute systolic (congestive) heart failure: Secondary | ICD-10-CM | POA: Diagnosis not present

## 2016-04-26 DIAGNOSIS — I2510 Atherosclerotic heart disease of native coronary artery without angina pectoris: Secondary | ICD-10-CM | POA: Diagnosis not present

## 2016-04-29 DIAGNOSIS — Z882 Allergy status to sulfonamides status: Secondary | ICD-10-CM | POA: Diagnosis not present

## 2016-04-29 DIAGNOSIS — Z08 Encounter for follow-up examination after completed treatment for malignant neoplasm: Secondary | ICD-10-CM | POA: Diagnosis not present

## 2016-04-29 DIAGNOSIS — R32 Unspecified urinary incontinence: Secondary | ICD-10-CM | POA: Diagnosis not present

## 2016-04-29 DIAGNOSIS — Z124 Encounter for screening for malignant neoplasm of cervix: Secondary | ICD-10-CM | POA: Diagnosis not present

## 2016-04-29 DIAGNOSIS — Z90722 Acquired absence of ovaries, bilateral: Secondary | ICD-10-CM | POA: Diagnosis not present

## 2016-04-29 DIAGNOSIS — Z9079 Acquired absence of other genital organ(s): Secondary | ICD-10-CM | POA: Diagnosis not present

## 2016-04-29 DIAGNOSIS — Z79899 Other long term (current) drug therapy: Secondary | ICD-10-CM | POA: Diagnosis not present

## 2016-04-29 DIAGNOSIS — Z90710 Acquired absence of both cervix and uterus: Secondary | ICD-10-CM | POA: Diagnosis not present

## 2016-04-29 DIAGNOSIS — I509 Heart failure, unspecified: Secondary | ICD-10-CM | POA: Diagnosis not present

## 2016-04-29 DIAGNOSIS — Z8541 Personal history of malignant neoplasm of cervix uteri: Secondary | ICD-10-CM | POA: Diagnosis not present

## 2016-04-29 DIAGNOSIS — C539 Malignant neoplasm of cervix uteri, unspecified: Secondary | ICD-10-CM | POA: Diagnosis not present

## 2016-05-01 DIAGNOSIS — I2510 Atherosclerotic heart disease of native coronary artery without angina pectoris: Secondary | ICD-10-CM | POA: Diagnosis not present

## 2016-05-01 DIAGNOSIS — I110 Hypertensive heart disease with heart failure: Secondary | ICD-10-CM | POA: Diagnosis not present

## 2016-05-01 DIAGNOSIS — E119 Type 2 diabetes mellitus without complications: Secondary | ICD-10-CM | POA: Diagnosis not present

## 2016-05-01 DIAGNOSIS — J45909 Unspecified asthma, uncomplicated: Secondary | ICD-10-CM | POA: Diagnosis not present

## 2016-05-01 DIAGNOSIS — I5021 Acute systolic (congestive) heart failure: Secondary | ICD-10-CM | POA: Diagnosis not present

## 2016-05-01 DIAGNOSIS — F329 Major depressive disorder, single episode, unspecified: Secondary | ICD-10-CM | POA: Diagnosis not present

## 2016-05-02 DIAGNOSIS — F329 Major depressive disorder, single episode, unspecified: Secondary | ICD-10-CM | POA: Diagnosis not present

## 2016-05-02 DIAGNOSIS — E119 Type 2 diabetes mellitus without complications: Secondary | ICD-10-CM | POA: Diagnosis not present

## 2016-05-02 DIAGNOSIS — J45909 Unspecified asthma, uncomplicated: Secondary | ICD-10-CM | POA: Diagnosis not present

## 2016-05-02 DIAGNOSIS — I110 Hypertensive heart disease with heart failure: Secondary | ICD-10-CM | POA: Diagnosis not present

## 2016-05-02 DIAGNOSIS — I5021 Acute systolic (congestive) heart failure: Secondary | ICD-10-CM | POA: Diagnosis not present

## 2016-05-02 DIAGNOSIS — I2510 Atherosclerotic heart disease of native coronary artery without angina pectoris: Secondary | ICD-10-CM | POA: Diagnosis not present

## 2016-05-08 DIAGNOSIS — I2510 Atherosclerotic heart disease of native coronary artery without angina pectoris: Secondary | ICD-10-CM | POA: Diagnosis not present

## 2016-05-08 DIAGNOSIS — J45909 Unspecified asthma, uncomplicated: Secondary | ICD-10-CM | POA: Diagnosis not present

## 2016-05-08 DIAGNOSIS — I5021 Acute systolic (congestive) heart failure: Secondary | ICD-10-CM | POA: Diagnosis not present

## 2016-05-08 DIAGNOSIS — F329 Major depressive disorder, single episode, unspecified: Secondary | ICD-10-CM | POA: Diagnosis not present

## 2016-05-08 DIAGNOSIS — I110 Hypertensive heart disease with heart failure: Secondary | ICD-10-CM | POA: Diagnosis not present

## 2016-05-08 DIAGNOSIS — E119 Type 2 diabetes mellitus without complications: Secondary | ICD-10-CM | POA: Diagnosis not present

## 2016-05-09 DIAGNOSIS — G4733 Obstructive sleep apnea (adult) (pediatric): Secondary | ICD-10-CM | POA: Diagnosis not present

## 2016-05-09 DIAGNOSIS — I2510 Atherosclerotic heart disease of native coronary artery without angina pectoris: Secondary | ICD-10-CM | POA: Diagnosis not present

## 2016-05-09 DIAGNOSIS — Z9989 Dependence on other enabling machines and devices: Secondary | ICD-10-CM | POA: Diagnosis not present

## 2016-05-09 DIAGNOSIS — I420 Dilated cardiomyopathy: Secondary | ICD-10-CM | POA: Diagnosis not present

## 2016-05-09 DIAGNOSIS — I10 Essential (primary) hypertension: Secondary | ICD-10-CM | POA: Diagnosis not present

## 2016-05-13 DIAGNOSIS — F3181 Bipolar II disorder: Secondary | ICD-10-CM | POA: Diagnosis not present

## 2016-05-16 DIAGNOSIS — I5021 Acute systolic (congestive) heart failure: Secondary | ICD-10-CM | POA: Diagnosis not present

## 2016-05-16 DIAGNOSIS — I2510 Atherosclerotic heart disease of native coronary artery without angina pectoris: Secondary | ICD-10-CM | POA: Diagnosis not present

## 2016-05-16 DIAGNOSIS — J45909 Unspecified asthma, uncomplicated: Secondary | ICD-10-CM | POA: Diagnosis not present

## 2016-05-16 DIAGNOSIS — E119 Type 2 diabetes mellitus without complications: Secondary | ICD-10-CM | POA: Diagnosis not present

## 2016-05-16 DIAGNOSIS — F329 Major depressive disorder, single episode, unspecified: Secondary | ICD-10-CM | POA: Diagnosis not present

## 2016-05-16 DIAGNOSIS — I110 Hypertensive heart disease with heart failure: Secondary | ICD-10-CM | POA: Diagnosis not present

## 2016-05-22 DIAGNOSIS — I5021 Acute systolic (congestive) heart failure: Secondary | ICD-10-CM | POA: Diagnosis not present

## 2016-05-22 DIAGNOSIS — F329 Major depressive disorder, single episode, unspecified: Secondary | ICD-10-CM | POA: Diagnosis not present

## 2016-05-22 DIAGNOSIS — I110 Hypertensive heart disease with heart failure: Secondary | ICD-10-CM | POA: Diagnosis not present

## 2016-05-22 DIAGNOSIS — E119 Type 2 diabetes mellitus without complications: Secondary | ICD-10-CM | POA: Diagnosis not present

## 2016-05-22 DIAGNOSIS — J45909 Unspecified asthma, uncomplicated: Secondary | ICD-10-CM | POA: Diagnosis not present

## 2016-05-22 DIAGNOSIS — I2510 Atherosclerotic heart disease of native coronary artery without angina pectoris: Secondary | ICD-10-CM | POA: Diagnosis not present

## 2016-05-23 DIAGNOSIS — I420 Dilated cardiomyopathy: Secondary | ICD-10-CM | POA: Diagnosis not present

## 2016-05-23 DIAGNOSIS — I2510 Atherosclerotic heart disease of native coronary artery without angina pectoris: Secondary | ICD-10-CM | POA: Diagnosis not present

## 2016-05-23 DIAGNOSIS — I10 Essential (primary) hypertension: Secondary | ICD-10-CM | POA: Diagnosis not present

## 2016-05-23 DIAGNOSIS — R0602 Shortness of breath: Secondary | ICD-10-CM | POA: Diagnosis not present

## 2016-05-30 DIAGNOSIS — J45909 Unspecified asthma, uncomplicated: Secondary | ICD-10-CM | POA: Diagnosis not present

## 2016-05-30 DIAGNOSIS — I110 Hypertensive heart disease with heart failure: Secondary | ICD-10-CM | POA: Diagnosis not present

## 2016-05-30 DIAGNOSIS — I2510 Atherosclerotic heart disease of native coronary artery without angina pectoris: Secondary | ICD-10-CM | POA: Diagnosis not present

## 2016-05-30 DIAGNOSIS — E119 Type 2 diabetes mellitus without complications: Secondary | ICD-10-CM | POA: Diagnosis not present

## 2016-05-30 DIAGNOSIS — I5021 Acute systolic (congestive) heart failure: Secondary | ICD-10-CM | POA: Diagnosis not present

## 2016-05-30 DIAGNOSIS — F329 Major depressive disorder, single episode, unspecified: Secondary | ICD-10-CM | POA: Diagnosis not present

## 2016-05-31 DIAGNOSIS — E785 Hyperlipidemia, unspecified: Secondary | ICD-10-CM | POA: Diagnosis not present

## 2016-05-31 DIAGNOSIS — Z23 Encounter for immunization: Secondary | ICD-10-CM | POA: Diagnosis not present

## 2016-05-31 DIAGNOSIS — G4733 Obstructive sleep apnea (adult) (pediatric): Secondary | ICD-10-CM | POA: Diagnosis not present

## 2016-05-31 DIAGNOSIS — I509 Heart failure, unspecified: Secondary | ICD-10-CM | POA: Diagnosis not present

## 2016-05-31 DIAGNOSIS — E119 Type 2 diabetes mellitus without complications: Secondary | ICD-10-CM | POA: Diagnosis not present

## 2016-05-31 DIAGNOSIS — R1084 Generalized abdominal pain: Secondary | ICD-10-CM | POA: Diagnosis not present

## 2016-05-31 DIAGNOSIS — I10 Essential (primary) hypertension: Secondary | ICD-10-CM | POA: Diagnosis not present

## 2016-06-11 DIAGNOSIS — E119 Type 2 diabetes mellitus without complications: Secondary | ICD-10-CM | POA: Diagnosis not present

## 2016-06-11 DIAGNOSIS — I10 Essential (primary) hypertension: Secondary | ICD-10-CM | POA: Diagnosis not present

## 2016-06-11 DIAGNOSIS — I509 Heart failure, unspecified: Secondary | ICD-10-CM | POA: Diagnosis not present

## 2016-06-11 DIAGNOSIS — Z87891 Personal history of nicotine dependence: Secondary | ICD-10-CM | POA: Diagnosis not present

## 2016-06-11 DIAGNOSIS — E7800 Pure hypercholesterolemia, unspecified: Secondary | ICD-10-CM | POA: Diagnosis not present

## 2016-06-11 DIAGNOSIS — J45909 Unspecified asthma, uncomplicated: Secondary | ICD-10-CM | POA: Diagnosis not present

## 2016-06-12 DIAGNOSIS — J45909 Unspecified asthma, uncomplicated: Secondary | ICD-10-CM | POA: Diagnosis not present

## 2016-06-12 DIAGNOSIS — Z87891 Personal history of nicotine dependence: Secondary | ICD-10-CM | POA: Diagnosis not present

## 2016-06-12 DIAGNOSIS — I509 Heart failure, unspecified: Secondary | ICD-10-CM | POA: Diagnosis not present

## 2016-06-12 DIAGNOSIS — E7800 Pure hypercholesterolemia, unspecified: Secondary | ICD-10-CM | POA: Diagnosis not present

## 2016-06-12 DIAGNOSIS — I10 Essential (primary) hypertension: Secondary | ICD-10-CM | POA: Diagnosis not present

## 2016-06-12 DIAGNOSIS — E119 Type 2 diabetes mellitus without complications: Secondary | ICD-10-CM | POA: Diagnosis not present

## 2016-06-14 DIAGNOSIS — I509 Heart failure, unspecified: Secondary | ICD-10-CM | POA: Diagnosis not present

## 2016-06-14 DIAGNOSIS — I10 Essential (primary) hypertension: Secondary | ICD-10-CM | POA: Diagnosis not present

## 2016-06-14 DIAGNOSIS — E119 Type 2 diabetes mellitus without complications: Secondary | ICD-10-CM | POA: Diagnosis not present

## 2016-06-14 DIAGNOSIS — J45909 Unspecified asthma, uncomplicated: Secondary | ICD-10-CM | POA: Diagnosis not present

## 2016-06-14 DIAGNOSIS — E7800 Pure hypercholesterolemia, unspecified: Secondary | ICD-10-CM | POA: Diagnosis not present

## 2016-06-14 DIAGNOSIS — Z87891 Personal history of nicotine dependence: Secondary | ICD-10-CM | POA: Diagnosis not present

## 2016-06-17 DIAGNOSIS — I509 Heart failure, unspecified: Secondary | ICD-10-CM | POA: Diagnosis not present

## 2016-06-17 DIAGNOSIS — E7800 Pure hypercholesterolemia, unspecified: Secondary | ICD-10-CM | POA: Diagnosis not present

## 2016-06-17 DIAGNOSIS — Z87891 Personal history of nicotine dependence: Secondary | ICD-10-CM | POA: Diagnosis not present

## 2016-06-17 DIAGNOSIS — E119 Type 2 diabetes mellitus without complications: Secondary | ICD-10-CM | POA: Diagnosis not present

## 2016-06-17 DIAGNOSIS — I10 Essential (primary) hypertension: Secondary | ICD-10-CM | POA: Diagnosis not present

## 2016-06-17 DIAGNOSIS — J45909 Unspecified asthma, uncomplicated: Secondary | ICD-10-CM | POA: Diagnosis not present

## 2016-06-18 DIAGNOSIS — I10 Essential (primary) hypertension: Secondary | ICD-10-CM | POA: Diagnosis not present

## 2016-06-18 DIAGNOSIS — R0789 Other chest pain: Secondary | ICD-10-CM | POA: Diagnosis not present

## 2016-06-19 DIAGNOSIS — E119 Type 2 diabetes mellitus without complications: Secondary | ICD-10-CM | POA: Diagnosis not present

## 2016-06-19 DIAGNOSIS — I10 Essential (primary) hypertension: Secondary | ICD-10-CM | POA: Diagnosis not present

## 2016-06-19 DIAGNOSIS — E7800 Pure hypercholesterolemia, unspecified: Secondary | ICD-10-CM | POA: Diagnosis not present

## 2016-06-19 DIAGNOSIS — Z87891 Personal history of nicotine dependence: Secondary | ICD-10-CM | POA: Diagnosis not present

## 2016-06-19 DIAGNOSIS — I509 Heart failure, unspecified: Secondary | ICD-10-CM | POA: Diagnosis not present

## 2016-06-19 DIAGNOSIS — J45909 Unspecified asthma, uncomplicated: Secondary | ICD-10-CM | POA: Diagnosis not present

## 2016-06-21 DIAGNOSIS — E7800 Pure hypercholesterolemia, unspecified: Secondary | ICD-10-CM | POA: Diagnosis not present

## 2016-06-21 DIAGNOSIS — I10 Essential (primary) hypertension: Secondary | ICD-10-CM | POA: Diagnosis not present

## 2016-06-21 DIAGNOSIS — Z87891 Personal history of nicotine dependence: Secondary | ICD-10-CM | POA: Diagnosis not present

## 2016-06-21 DIAGNOSIS — E119 Type 2 diabetes mellitus without complications: Secondary | ICD-10-CM | POA: Diagnosis not present

## 2016-06-21 DIAGNOSIS — J45909 Unspecified asthma, uncomplicated: Secondary | ICD-10-CM | POA: Diagnosis not present

## 2016-06-21 DIAGNOSIS — I509 Heart failure, unspecified: Secondary | ICD-10-CM | POA: Diagnosis not present

## 2016-06-23 DIAGNOSIS — J0100 Acute maxillary sinusitis, unspecified: Secondary | ICD-10-CM | POA: Diagnosis not present

## 2016-06-23 DIAGNOSIS — J040 Acute laryngitis: Secondary | ICD-10-CM | POA: Diagnosis not present

## 2016-06-23 DIAGNOSIS — J209 Acute bronchitis, unspecified: Secondary | ICD-10-CM | POA: Diagnosis not present

## 2016-06-27 DIAGNOSIS — G4733 Obstructive sleep apnea (adult) (pediatric): Secondary | ICD-10-CM | POA: Diagnosis not present

## 2016-06-27 DIAGNOSIS — I420 Dilated cardiomyopathy: Secondary | ICD-10-CM | POA: Diagnosis not present

## 2016-06-27 DIAGNOSIS — I2510 Atherosclerotic heart disease of native coronary artery without angina pectoris: Secondary | ICD-10-CM | POA: Diagnosis not present

## 2016-06-27 DIAGNOSIS — I10 Essential (primary) hypertension: Secondary | ICD-10-CM | POA: Diagnosis not present

## 2016-06-27 DIAGNOSIS — Z9989 Dependence on other enabling machines and devices: Secondary | ICD-10-CM | POA: Diagnosis not present

## 2016-07-01 DIAGNOSIS — J45909 Unspecified asthma, uncomplicated: Secondary | ICD-10-CM | POA: Diagnosis not present

## 2016-07-01 DIAGNOSIS — I509 Heart failure, unspecified: Secondary | ICD-10-CM | POA: Diagnosis not present

## 2016-07-01 DIAGNOSIS — I10 Essential (primary) hypertension: Secondary | ICD-10-CM | POA: Diagnosis not present

## 2016-07-01 DIAGNOSIS — E119 Type 2 diabetes mellitus without complications: Secondary | ICD-10-CM | POA: Diagnosis not present

## 2016-07-01 DIAGNOSIS — E7800 Pure hypercholesterolemia, unspecified: Secondary | ICD-10-CM | POA: Diagnosis not present

## 2016-07-01 DIAGNOSIS — Z87891 Personal history of nicotine dependence: Secondary | ICD-10-CM | POA: Diagnosis not present

## 2016-07-03 DIAGNOSIS — J45909 Unspecified asthma, uncomplicated: Secondary | ICD-10-CM | POA: Diagnosis not present

## 2016-07-03 DIAGNOSIS — Z87891 Personal history of nicotine dependence: Secondary | ICD-10-CM | POA: Diagnosis not present

## 2016-07-03 DIAGNOSIS — E7800 Pure hypercholesterolemia, unspecified: Secondary | ICD-10-CM | POA: Diagnosis not present

## 2016-07-03 DIAGNOSIS — I509 Heart failure, unspecified: Secondary | ICD-10-CM | POA: Diagnosis not present

## 2016-07-03 DIAGNOSIS — E119 Type 2 diabetes mellitus without complications: Secondary | ICD-10-CM | POA: Diagnosis not present

## 2016-07-03 DIAGNOSIS — I110 Hypertensive heart disease with heart failure: Secondary | ICD-10-CM | POA: Diagnosis not present

## 2016-07-04 DIAGNOSIS — H353132 Nonexudative age-related macular degeneration, bilateral, intermediate dry stage: Secondary | ICD-10-CM | POA: Diagnosis not present

## 2016-07-08 DIAGNOSIS — I509 Heart failure, unspecified: Secondary | ICD-10-CM | POA: Diagnosis not present

## 2016-07-08 DIAGNOSIS — I110 Hypertensive heart disease with heart failure: Secondary | ICD-10-CM | POA: Diagnosis not present

## 2016-07-08 DIAGNOSIS — Z87891 Personal history of nicotine dependence: Secondary | ICD-10-CM | POA: Diagnosis not present

## 2016-07-08 DIAGNOSIS — E119 Type 2 diabetes mellitus without complications: Secondary | ICD-10-CM | POA: Diagnosis not present

## 2016-07-08 DIAGNOSIS — J45909 Unspecified asthma, uncomplicated: Secondary | ICD-10-CM | POA: Diagnosis not present

## 2016-07-08 DIAGNOSIS — E7800 Pure hypercholesterolemia, unspecified: Secondary | ICD-10-CM | POA: Diagnosis not present

## 2016-07-10 DIAGNOSIS — E7800 Pure hypercholesterolemia, unspecified: Secondary | ICD-10-CM | POA: Diagnosis not present

## 2016-07-10 DIAGNOSIS — E119 Type 2 diabetes mellitus without complications: Secondary | ICD-10-CM | POA: Diagnosis not present

## 2016-07-10 DIAGNOSIS — I110 Hypertensive heart disease with heart failure: Secondary | ICD-10-CM | POA: Diagnosis not present

## 2016-07-10 DIAGNOSIS — I509 Heart failure, unspecified: Secondary | ICD-10-CM | POA: Diagnosis not present

## 2016-07-10 DIAGNOSIS — Z87891 Personal history of nicotine dependence: Secondary | ICD-10-CM | POA: Diagnosis not present

## 2016-07-10 DIAGNOSIS — J45909 Unspecified asthma, uncomplicated: Secondary | ICD-10-CM | POA: Diagnosis not present

## 2016-07-15 DIAGNOSIS — M545 Low back pain: Secondary | ICD-10-CM | POA: Diagnosis not present

## 2016-07-15 DIAGNOSIS — L508 Other urticaria: Secondary | ICD-10-CM | POA: Diagnosis not present

## 2016-07-15 DIAGNOSIS — B86 Scabies: Secondary | ICD-10-CM | POA: Diagnosis not present

## 2016-07-15 DIAGNOSIS — I10 Essential (primary) hypertension: Secondary | ICD-10-CM | POA: Diagnosis not present

## 2016-07-17 DIAGNOSIS — E119 Type 2 diabetes mellitus without complications: Secondary | ICD-10-CM | POA: Diagnosis not present

## 2016-07-17 DIAGNOSIS — J45909 Unspecified asthma, uncomplicated: Secondary | ICD-10-CM | POA: Diagnosis not present

## 2016-07-17 DIAGNOSIS — I110 Hypertensive heart disease with heart failure: Secondary | ICD-10-CM | POA: Diagnosis not present

## 2016-07-17 DIAGNOSIS — I509 Heart failure, unspecified: Secondary | ICD-10-CM | POA: Diagnosis not present

## 2016-07-17 DIAGNOSIS — Z87891 Personal history of nicotine dependence: Secondary | ICD-10-CM | POA: Diagnosis not present

## 2016-07-17 DIAGNOSIS — E7800 Pure hypercholesterolemia, unspecified: Secondary | ICD-10-CM | POA: Diagnosis not present

## 2016-07-19 DIAGNOSIS — Z87891 Personal history of nicotine dependence: Secondary | ICD-10-CM | POA: Diagnosis not present

## 2016-07-19 DIAGNOSIS — I110 Hypertensive heart disease with heart failure: Secondary | ICD-10-CM | POA: Diagnosis not present

## 2016-07-19 DIAGNOSIS — I509 Heart failure, unspecified: Secondary | ICD-10-CM | POA: Diagnosis not present

## 2016-07-19 DIAGNOSIS — E119 Type 2 diabetes mellitus without complications: Secondary | ICD-10-CM | POA: Diagnosis not present

## 2016-07-19 DIAGNOSIS — E7800 Pure hypercholesterolemia, unspecified: Secondary | ICD-10-CM | POA: Diagnosis not present

## 2016-07-19 DIAGNOSIS — J45909 Unspecified asthma, uncomplicated: Secondary | ICD-10-CM | POA: Diagnosis not present

## 2016-07-22 DIAGNOSIS — E7800 Pure hypercholesterolemia, unspecified: Secondary | ICD-10-CM | POA: Diagnosis not present

## 2016-07-22 DIAGNOSIS — J45909 Unspecified asthma, uncomplicated: Secondary | ICD-10-CM | POA: Diagnosis not present

## 2016-07-22 DIAGNOSIS — E119 Type 2 diabetes mellitus without complications: Secondary | ICD-10-CM | POA: Diagnosis not present

## 2016-07-22 DIAGNOSIS — I509 Heart failure, unspecified: Secondary | ICD-10-CM | POA: Diagnosis not present

## 2016-07-22 DIAGNOSIS — Z87891 Personal history of nicotine dependence: Secondary | ICD-10-CM | POA: Diagnosis not present

## 2016-07-22 DIAGNOSIS — I110 Hypertensive heart disease with heart failure: Secondary | ICD-10-CM | POA: Diagnosis not present

## 2016-07-24 DIAGNOSIS — J45909 Unspecified asthma, uncomplicated: Secondary | ICD-10-CM | POA: Diagnosis not present

## 2016-07-24 DIAGNOSIS — M5431 Sciatica, right side: Secondary | ICD-10-CM | POA: Diagnosis not present

## 2016-07-24 DIAGNOSIS — M2560 Stiffness of unspecified joint, not elsewhere classified: Secondary | ICD-10-CM | POA: Diagnosis not present

## 2016-07-24 DIAGNOSIS — I110 Hypertensive heart disease with heart failure: Secondary | ICD-10-CM | POA: Diagnosis not present

## 2016-07-24 DIAGNOSIS — E119 Type 2 diabetes mellitus without complications: Secondary | ICD-10-CM | POA: Diagnosis not present

## 2016-07-24 DIAGNOSIS — I509 Heart failure, unspecified: Secondary | ICD-10-CM | POA: Diagnosis not present

## 2016-07-24 DIAGNOSIS — M5432 Sciatica, left side: Secondary | ICD-10-CM | POA: Diagnosis not present

## 2016-07-24 DIAGNOSIS — Z87891 Personal history of nicotine dependence: Secondary | ICD-10-CM | POA: Diagnosis not present

## 2016-07-24 DIAGNOSIS — E7800 Pure hypercholesterolemia, unspecified: Secondary | ICD-10-CM | POA: Diagnosis not present

## 2016-07-24 DIAGNOSIS — M545 Low back pain: Secondary | ICD-10-CM | POA: Diagnosis not present

## 2016-07-24 DIAGNOSIS — M6281 Muscle weakness (generalized): Secondary | ICD-10-CM | POA: Diagnosis not present

## 2016-07-26 DIAGNOSIS — S91332A Puncture wound without foreign body, left foot, initial encounter: Secondary | ICD-10-CM | POA: Diagnosis not present

## 2016-07-31 DIAGNOSIS — E119 Type 2 diabetes mellitus without complications: Secondary | ICD-10-CM | POA: Diagnosis not present

## 2016-07-31 DIAGNOSIS — I110 Hypertensive heart disease with heart failure: Secondary | ICD-10-CM | POA: Diagnosis not present

## 2016-07-31 DIAGNOSIS — J45909 Unspecified asthma, uncomplicated: Secondary | ICD-10-CM | POA: Diagnosis not present

## 2016-07-31 DIAGNOSIS — I509 Heart failure, unspecified: Secondary | ICD-10-CM | POA: Diagnosis not present

## 2016-07-31 DIAGNOSIS — Z87891 Personal history of nicotine dependence: Secondary | ICD-10-CM | POA: Diagnosis not present

## 2016-07-31 DIAGNOSIS — E7800 Pure hypercholesterolemia, unspecified: Secondary | ICD-10-CM | POA: Diagnosis not present

## 2016-08-05 DIAGNOSIS — I110 Hypertensive heart disease with heart failure: Secondary | ICD-10-CM | POA: Diagnosis not present

## 2016-08-05 DIAGNOSIS — E7800 Pure hypercholesterolemia, unspecified: Secondary | ICD-10-CM | POA: Diagnosis not present

## 2016-08-05 DIAGNOSIS — J45909 Unspecified asthma, uncomplicated: Secondary | ICD-10-CM | POA: Diagnosis not present

## 2016-08-05 DIAGNOSIS — E119 Type 2 diabetes mellitus without complications: Secondary | ICD-10-CM | POA: Diagnosis not present

## 2016-08-05 DIAGNOSIS — I509 Heart failure, unspecified: Secondary | ICD-10-CM | POA: Diagnosis not present

## 2016-08-05 DIAGNOSIS — Z87891 Personal history of nicotine dependence: Secondary | ICD-10-CM | POA: Diagnosis not present

## 2016-08-09 DIAGNOSIS — Z9181 History of falling: Secondary | ICD-10-CM | POA: Diagnosis not present

## 2016-08-09 DIAGNOSIS — R5383 Other fatigue: Secondary | ICD-10-CM | POA: Diagnosis not present

## 2016-08-09 DIAGNOSIS — I509 Heart failure, unspecified: Secondary | ICD-10-CM | POA: Diagnosis not present

## 2016-08-09 DIAGNOSIS — R5381 Other malaise: Secondary | ICD-10-CM | POA: Diagnosis not present

## 2016-08-09 DIAGNOSIS — D539 Nutritional anemia, unspecified: Secondary | ICD-10-CM | POA: Diagnosis not present

## 2016-08-12 DIAGNOSIS — I110 Hypertensive heart disease with heart failure: Secondary | ICD-10-CM | POA: Diagnosis not present

## 2016-08-12 DIAGNOSIS — I10 Essential (primary) hypertension: Secondary | ICD-10-CM | POA: Diagnosis not present

## 2016-08-12 DIAGNOSIS — J45909 Unspecified asthma, uncomplicated: Secondary | ICD-10-CM | POA: Diagnosis not present

## 2016-08-12 DIAGNOSIS — E7800 Pure hypercholesterolemia, unspecified: Secondary | ICD-10-CM | POA: Diagnosis not present

## 2016-08-12 DIAGNOSIS — Z87891 Personal history of nicotine dependence: Secondary | ICD-10-CM | POA: Diagnosis not present

## 2016-08-12 DIAGNOSIS — D539 Nutritional anemia, unspecified: Secondary | ICD-10-CM | POA: Diagnosis not present

## 2016-08-12 DIAGNOSIS — E119 Type 2 diabetes mellitus without complications: Secondary | ICD-10-CM | POA: Diagnosis not present

## 2016-08-12 DIAGNOSIS — I509 Heart failure, unspecified: Secondary | ICD-10-CM | POA: Diagnosis not present

## 2016-08-14 DIAGNOSIS — I509 Heart failure, unspecified: Secondary | ICD-10-CM | POA: Diagnosis not present

## 2016-08-14 DIAGNOSIS — I110 Hypertensive heart disease with heart failure: Secondary | ICD-10-CM | POA: Diagnosis not present

## 2016-08-14 DIAGNOSIS — M5431 Sciatica, right side: Secondary | ICD-10-CM | POA: Diagnosis not present

## 2016-08-14 DIAGNOSIS — I420 Dilated cardiomyopathy: Secondary | ICD-10-CM | POA: Diagnosis not present

## 2016-08-14 DIAGNOSIS — I2510 Atherosclerotic heart disease of native coronary artery without angina pectoris: Secondary | ICD-10-CM | POA: Diagnosis not present

## 2016-08-14 DIAGNOSIS — M6281 Muscle weakness (generalized): Secondary | ICD-10-CM | POA: Diagnosis not present

## 2016-08-14 DIAGNOSIS — I10 Essential (primary) hypertension: Secondary | ICD-10-CM | POA: Diagnosis not present

## 2016-08-14 DIAGNOSIS — M2560 Stiffness of unspecified joint, not elsewhere classified: Secondary | ICD-10-CM | POA: Diagnosis not present

## 2016-08-14 DIAGNOSIS — Z87891 Personal history of nicotine dependence: Secondary | ICD-10-CM | POA: Diagnosis not present

## 2016-08-14 DIAGNOSIS — E7800 Pure hypercholesterolemia, unspecified: Secondary | ICD-10-CM | POA: Diagnosis not present

## 2016-08-14 DIAGNOSIS — M5432 Sciatica, left side: Secondary | ICD-10-CM | POA: Diagnosis not present

## 2016-08-14 DIAGNOSIS — J45909 Unspecified asthma, uncomplicated: Secondary | ICD-10-CM | POA: Diagnosis not present

## 2016-08-14 DIAGNOSIS — E119 Type 2 diabetes mellitus without complications: Secondary | ICD-10-CM | POA: Diagnosis not present

## 2016-08-14 DIAGNOSIS — M545 Low back pain: Secondary | ICD-10-CM | POA: Diagnosis not present

## 2016-08-16 DIAGNOSIS — Z87891 Personal history of nicotine dependence: Secondary | ICD-10-CM | POA: Diagnosis not present

## 2016-08-16 DIAGNOSIS — J45909 Unspecified asthma, uncomplicated: Secondary | ICD-10-CM | POA: Diagnosis not present

## 2016-08-16 DIAGNOSIS — I509 Heart failure, unspecified: Secondary | ICD-10-CM | POA: Diagnosis not present

## 2016-08-16 DIAGNOSIS — I110 Hypertensive heart disease with heart failure: Secondary | ICD-10-CM | POA: Diagnosis not present

## 2016-08-16 DIAGNOSIS — E7800 Pure hypercholesterolemia, unspecified: Secondary | ICD-10-CM | POA: Diagnosis not present

## 2016-08-16 DIAGNOSIS — E119 Type 2 diabetes mellitus without complications: Secondary | ICD-10-CM | POA: Diagnosis not present

## 2016-08-19 DIAGNOSIS — R635 Abnormal weight gain: Secondary | ICD-10-CM | POA: Diagnosis not present

## 2016-08-19 DIAGNOSIS — D539 Nutritional anemia, unspecified: Secondary | ICD-10-CM | POA: Diagnosis not present

## 2016-08-19 DIAGNOSIS — E7800 Pure hypercholesterolemia, unspecified: Secondary | ICD-10-CM | POA: Diagnosis not present

## 2016-08-19 DIAGNOSIS — E119 Type 2 diabetes mellitus without complications: Secondary | ICD-10-CM | POA: Diagnosis not present

## 2016-08-19 DIAGNOSIS — I10 Essential (primary) hypertension: Secondary | ICD-10-CM | POA: Diagnosis not present

## 2016-08-19 DIAGNOSIS — M6281 Muscle weakness (generalized): Secondary | ICD-10-CM | POA: Diagnosis not present

## 2016-08-19 DIAGNOSIS — M545 Low back pain: Secondary | ICD-10-CM | POA: Diagnosis not present

## 2016-08-19 DIAGNOSIS — I509 Heart failure, unspecified: Secondary | ICD-10-CM | POA: Diagnosis not present

## 2016-08-19 DIAGNOSIS — Z87891 Personal history of nicotine dependence: Secondary | ICD-10-CM | POA: Diagnosis not present

## 2016-08-19 DIAGNOSIS — R05 Cough: Secondary | ICD-10-CM | POA: Diagnosis not present

## 2016-08-19 DIAGNOSIS — M5432 Sciatica, left side: Secondary | ICD-10-CM | POA: Diagnosis not present

## 2016-08-19 DIAGNOSIS — M2560 Stiffness of unspecified joint, not elsewhere classified: Secondary | ICD-10-CM | POA: Diagnosis not present

## 2016-08-19 DIAGNOSIS — I110 Hypertensive heart disease with heart failure: Secondary | ICD-10-CM | POA: Diagnosis not present

## 2016-08-19 DIAGNOSIS — M5431 Sciatica, right side: Secondary | ICD-10-CM | POA: Diagnosis not present

## 2016-08-19 DIAGNOSIS — J45909 Unspecified asthma, uncomplicated: Secondary | ICD-10-CM | POA: Diagnosis not present

## 2016-08-20 DIAGNOSIS — Z1231 Encounter for screening mammogram for malignant neoplasm of breast: Secondary | ICD-10-CM | POA: Diagnosis not present

## 2016-08-21 DIAGNOSIS — E119 Type 2 diabetes mellitus without complications: Secondary | ICD-10-CM | POA: Diagnosis not present

## 2016-08-21 DIAGNOSIS — I110 Hypertensive heart disease with heart failure: Secondary | ICD-10-CM | POA: Diagnosis not present

## 2016-08-21 DIAGNOSIS — Z87891 Personal history of nicotine dependence: Secondary | ICD-10-CM | POA: Diagnosis not present

## 2016-08-21 DIAGNOSIS — I509 Heart failure, unspecified: Secondary | ICD-10-CM | POA: Diagnosis not present

## 2016-08-21 DIAGNOSIS — E7800 Pure hypercholesterolemia, unspecified: Secondary | ICD-10-CM | POA: Diagnosis not present

## 2016-08-21 DIAGNOSIS — J45909 Unspecified asthma, uncomplicated: Secondary | ICD-10-CM | POA: Diagnosis not present

## 2016-08-23 DIAGNOSIS — I509 Heart failure, unspecified: Secondary | ICD-10-CM | POA: Diagnosis not present

## 2016-08-23 DIAGNOSIS — D539 Nutritional anemia, unspecified: Secondary | ICD-10-CM | POA: Diagnosis not present

## 2016-08-23 DIAGNOSIS — I10 Essential (primary) hypertension: Secondary | ICD-10-CM | POA: Diagnosis not present

## 2016-08-23 DIAGNOSIS — F3181 Bipolar II disorder: Secondary | ICD-10-CM | POA: Diagnosis not present

## 2016-08-27 DIAGNOSIS — D473 Essential (hemorrhagic) thrombocythemia: Secondary | ICD-10-CM | POA: Diagnosis not present

## 2016-08-27 DIAGNOSIS — I509 Heart failure, unspecified: Secondary | ICD-10-CM | POA: Diagnosis not present

## 2016-08-27 DIAGNOSIS — I959 Hypotension, unspecified: Secondary | ICD-10-CM | POA: Diagnosis not present

## 2016-08-27 DIAGNOSIS — D539 Nutritional anemia, unspecified: Secondary | ICD-10-CM | POA: Diagnosis not present

## 2016-08-27 DIAGNOSIS — J208 Acute bronchitis due to other specified organisms: Secondary | ICD-10-CM | POA: Diagnosis not present

## 2016-08-28 DIAGNOSIS — E119 Type 2 diabetes mellitus without complications: Secondary | ICD-10-CM | POA: Diagnosis not present

## 2016-08-28 DIAGNOSIS — E7800 Pure hypercholesterolemia, unspecified: Secondary | ICD-10-CM | POA: Diagnosis not present

## 2016-08-28 DIAGNOSIS — I509 Heart failure, unspecified: Secondary | ICD-10-CM | POA: Diagnosis not present

## 2016-08-28 DIAGNOSIS — J45909 Unspecified asthma, uncomplicated: Secondary | ICD-10-CM | POA: Diagnosis not present

## 2016-08-28 DIAGNOSIS — Z87891 Personal history of nicotine dependence: Secondary | ICD-10-CM | POA: Diagnosis not present

## 2016-08-28 DIAGNOSIS — I110 Hypertensive heart disease with heart failure: Secondary | ICD-10-CM | POA: Diagnosis not present

## 2016-08-30 DIAGNOSIS — I2510 Atherosclerotic heart disease of native coronary artery without angina pectoris: Secondary | ICD-10-CM | POA: Diagnosis not present

## 2016-08-30 DIAGNOSIS — R609 Edema, unspecified: Secondary | ICD-10-CM | POA: Diagnosis not present

## 2016-08-30 DIAGNOSIS — I420 Dilated cardiomyopathy: Secondary | ICD-10-CM | POA: Diagnosis not present

## 2016-08-30 DIAGNOSIS — I10 Essential (primary) hypertension: Secondary | ICD-10-CM | POA: Diagnosis not present

## 2016-09-03 DIAGNOSIS — I509 Heart failure, unspecified: Secondary | ICD-10-CM | POA: Diagnosis not present

## 2016-09-03 DIAGNOSIS — J208 Acute bronchitis due to other specified organisms: Secondary | ICD-10-CM | POA: Diagnosis not present

## 2016-09-03 DIAGNOSIS — R300 Dysuria: Secondary | ICD-10-CM | POA: Diagnosis not present

## 2016-09-03 DIAGNOSIS — D539 Nutritional anemia, unspecified: Secondary | ICD-10-CM | POA: Diagnosis not present

## 2016-09-03 DIAGNOSIS — I10 Essential (primary) hypertension: Secondary | ICD-10-CM | POA: Diagnosis not present

## 2016-12-28 DIAGNOSIS — R1013 Epigastric pain: Secondary | ICD-10-CM

## 2016-12-28 DIAGNOSIS — R079 Chest pain, unspecified: Secondary | ICD-10-CM | POA: Diagnosis not present

## 2016-12-28 DIAGNOSIS — E119 Type 2 diabetes mellitus without complications: Secondary | ICD-10-CM

## 2016-12-28 DIAGNOSIS — I429 Cardiomyopathy, unspecified: Secondary | ICD-10-CM

## 2016-12-28 DIAGNOSIS — E785 Hyperlipidemia, unspecified: Secondary | ICD-10-CM | POA: Diagnosis not present

## 2016-12-28 DIAGNOSIS — I10 Essential (primary) hypertension: Secondary | ICD-10-CM | POA: Diagnosis not present

## 2016-12-28 DIAGNOSIS — E669 Obesity, unspecified: Secondary | ICD-10-CM | POA: Diagnosis not present

## 2016-12-28 DIAGNOSIS — Z95810 Presence of automatic (implantable) cardiac defibrillator: Secondary | ICD-10-CM

## 2016-12-28 DIAGNOSIS — G8929 Other chronic pain: Secondary | ICD-10-CM | POA: Diagnosis not present

## 2016-12-29 DIAGNOSIS — E785 Hyperlipidemia, unspecified: Secondary | ICD-10-CM | POA: Diagnosis not present

## 2016-12-29 DIAGNOSIS — I10 Essential (primary) hypertension: Secondary | ICD-10-CM | POA: Diagnosis not present

## 2016-12-29 DIAGNOSIS — R1013 Epigastric pain: Secondary | ICD-10-CM | POA: Diagnosis not present

## 2016-12-29 DIAGNOSIS — R079 Chest pain, unspecified: Secondary | ICD-10-CM | POA: Diagnosis not present

## 2017-06-16 ENCOUNTER — Other Ambulatory Visit: Payer: Self-pay

## 2017-07-12 ENCOUNTER — Other Ambulatory Visit: Payer: Self-pay | Admitting: Cardiology

## 2017-08-18 ENCOUNTER — Other Ambulatory Visit: Payer: Self-pay | Admitting: Cardiology

## 2017-11-19 ENCOUNTER — Encounter: Payer: Self-pay | Admitting: Gastroenterology

## 2017-11-22 ENCOUNTER — Other Ambulatory Visit: Payer: Self-pay | Admitting: Cardiology

## 2017-12-03 DIAGNOSIS — J181 Lobar pneumonia, unspecified organism: Secondary | ICD-10-CM

## 2017-12-03 DIAGNOSIS — R748 Abnormal levels of other serum enzymes: Secondary | ICD-10-CM

## 2017-12-03 DIAGNOSIS — I10 Essential (primary) hypertension: Secondary | ICD-10-CM

## 2017-12-03 DIAGNOSIS — I509 Heart failure, unspecified: Secondary | ICD-10-CM | POA: Diagnosis not present

## 2017-12-03 DIAGNOSIS — E785 Hyperlipidemia, unspecified: Secondary | ICD-10-CM | POA: Diagnosis not present

## 2017-12-03 DIAGNOSIS — Z95810 Presence of automatic (implantable) cardiac defibrillator: Secondary | ICD-10-CM

## 2017-12-03 DIAGNOSIS — E119 Type 2 diabetes mellitus without complications: Secondary | ICD-10-CM | POA: Diagnosis not present

## 2017-12-04 DIAGNOSIS — I10 Essential (primary) hypertension: Secondary | ICD-10-CM | POA: Diagnosis not present

## 2017-12-04 DIAGNOSIS — R748 Abnormal levels of other serum enzymes: Secondary | ICD-10-CM | POA: Diagnosis not present

## 2017-12-04 DIAGNOSIS — J181 Lobar pneumonia, unspecified organism: Secondary | ICD-10-CM | POA: Diagnosis not present

## 2017-12-04 DIAGNOSIS — I509 Heart failure, unspecified: Secondary | ICD-10-CM | POA: Diagnosis not present

## 2017-12-04 DIAGNOSIS — E785 Hyperlipidemia, unspecified: Secondary | ICD-10-CM | POA: Diagnosis not present

## 2018-04-10 DIAGNOSIS — L508 Other urticaria: Secondary | ICD-10-CM | POA: Diagnosis not present

## 2018-04-10 DIAGNOSIS — R69 Illness, unspecified: Secondary | ICD-10-CM | POA: Diagnosis not present

## 2018-04-14 DIAGNOSIS — C539 Malignant neoplasm of cervix uteri, unspecified: Secondary | ICD-10-CM | POA: Diagnosis not present

## 2018-04-24 DIAGNOSIS — R69 Illness, unspecified: Secondary | ICD-10-CM | POA: Diagnosis not present

## 2018-04-24 DIAGNOSIS — L508 Other urticaria: Secondary | ICD-10-CM | POA: Diagnosis not present

## 2018-04-24 DIAGNOSIS — R1011 Right upper quadrant pain: Secondary | ICD-10-CM | POA: Diagnosis not present

## 2018-04-24 DIAGNOSIS — R1013 Epigastric pain: Secondary | ICD-10-CM | POA: Diagnosis not present

## 2018-04-24 DIAGNOSIS — K5730 Diverticulosis of large intestine without perforation or abscess without bleeding: Secondary | ICD-10-CM | POA: Diagnosis not present

## 2018-04-24 DIAGNOSIS — E871 Hypo-osmolality and hyponatremia: Secondary | ICD-10-CM | POA: Diagnosis not present

## 2018-04-25 DIAGNOSIS — E871 Hypo-osmolality and hyponatremia: Secondary | ICD-10-CM | POA: Diagnosis not present

## 2018-04-25 DIAGNOSIS — G4733 Obstructive sleep apnea (adult) (pediatric): Secondary | ICD-10-CM | POA: Diagnosis not present

## 2018-04-25 DIAGNOSIS — R0902 Hypoxemia: Secondary | ICD-10-CM | POA: Diagnosis not present

## 2018-04-25 DIAGNOSIS — D473 Essential (hemorrhagic) thrombocythemia: Secondary | ICD-10-CM | POA: Diagnosis not present

## 2018-04-25 DIAGNOSIS — Z7982 Long term (current) use of aspirin: Secondary | ICD-10-CM | POA: Diagnosis not present

## 2018-04-25 DIAGNOSIS — R1013 Epigastric pain: Secondary | ICD-10-CM | POA: Diagnosis not present

## 2018-04-25 DIAGNOSIS — I509 Heart failure, unspecified: Secondary | ICD-10-CM | POA: Diagnosis not present

## 2018-04-25 DIAGNOSIS — J45909 Unspecified asthma, uncomplicated: Secondary | ICD-10-CM | POA: Diagnosis not present

## 2018-04-25 DIAGNOSIS — Z95810 Presence of automatic (implantable) cardiac defibrillator: Secondary | ICD-10-CM | POA: Diagnosis not present

## 2018-04-25 DIAGNOSIS — I10 Essential (primary) hypertension: Secondary | ICD-10-CM | POA: Diagnosis not present

## 2018-04-25 DIAGNOSIS — E119 Type 2 diabetes mellitus without complications: Secondary | ICD-10-CM | POA: Diagnosis not present

## 2018-04-25 DIAGNOSIS — K5730 Diverticulosis of large intestine without perforation or abscess without bleeding: Secondary | ICD-10-CM | POA: Diagnosis not present

## 2018-04-25 DIAGNOSIS — K219 Gastro-esophageal reflux disease without esophagitis: Secondary | ICD-10-CM | POA: Diagnosis not present

## 2018-04-25 DIAGNOSIS — R112 Nausea with vomiting, unspecified: Secondary | ICD-10-CM | POA: Diagnosis not present

## 2018-04-25 DIAGNOSIS — I110 Hypertensive heart disease with heart failure: Secondary | ICD-10-CM | POA: Diagnosis not present

## 2018-04-25 DIAGNOSIS — R1011 Right upper quadrant pain: Secondary | ICD-10-CM | POA: Diagnosis not present

## 2018-04-25 DIAGNOSIS — K529 Noninfective gastroenteritis and colitis, unspecified: Secondary | ICD-10-CM | POA: Diagnosis not present

## 2018-05-01 DIAGNOSIS — R300 Dysuria: Secondary | ICD-10-CM | POA: Diagnosis not present

## 2018-05-01 DIAGNOSIS — K529 Noninfective gastroenteritis and colitis, unspecified: Secondary | ICD-10-CM | POA: Diagnosis not present

## 2018-05-01 DIAGNOSIS — E871 Hypo-osmolality and hyponatremia: Secondary | ICD-10-CM | POA: Diagnosis not present

## 2018-05-01 DIAGNOSIS — Z79899 Other long term (current) drug therapy: Secondary | ICD-10-CM | POA: Diagnosis not present

## 2018-05-01 DIAGNOSIS — Z6828 Body mass index (BMI) 28.0-28.9, adult: Secondary | ICD-10-CM | POA: Diagnosis not present

## 2018-05-11 DIAGNOSIS — R112 Nausea with vomiting, unspecified: Secondary | ICD-10-CM | POA: Diagnosis not present

## 2018-05-11 DIAGNOSIS — R079 Chest pain, unspecified: Secondary | ICD-10-CM | POA: Diagnosis not present

## 2018-05-11 DIAGNOSIS — Z6829 Body mass index (BMI) 29.0-29.9, adult: Secondary | ICD-10-CM | POA: Diagnosis not present

## 2018-05-11 DIAGNOSIS — R1013 Epigastric pain: Secondary | ICD-10-CM | POA: Diagnosis not present

## 2018-05-15 DIAGNOSIS — E871 Hypo-osmolality and hyponatremia: Secondary | ICD-10-CM | POA: Diagnosis not present

## 2018-05-26 DIAGNOSIS — G4733 Obstructive sleep apnea (adult) (pediatric): Secondary | ICD-10-CM | POA: Diagnosis not present

## 2018-05-26 DIAGNOSIS — Z6831 Body mass index (BMI) 31.0-31.9, adult: Secondary | ICD-10-CM | POA: Diagnosis not present

## 2018-05-26 DIAGNOSIS — I509 Heart failure, unspecified: Secondary | ICD-10-CM | POA: Diagnosis not present

## 2018-05-26 DIAGNOSIS — E119 Type 2 diabetes mellitus without complications: Secondary | ICD-10-CM | POA: Diagnosis not present

## 2018-05-26 DIAGNOSIS — R0902 Hypoxemia: Secondary | ICD-10-CM | POA: Diagnosis not present

## 2018-05-26 DIAGNOSIS — R69 Illness, unspecified: Secondary | ICD-10-CM | POA: Diagnosis not present

## 2018-05-26 DIAGNOSIS — R32 Unspecified urinary incontinence: Secondary | ICD-10-CM | POA: Diagnosis not present

## 2018-05-26 DIAGNOSIS — E785 Hyperlipidemia, unspecified: Secondary | ICD-10-CM | POA: Diagnosis not present

## 2018-05-26 DIAGNOSIS — I10 Essential (primary) hypertension: Secondary | ICD-10-CM | POA: Diagnosis not present

## 2018-06-10 DIAGNOSIS — R69 Illness, unspecified: Secondary | ICD-10-CM | POA: Diagnosis not present

## 2018-06-16 DIAGNOSIS — Z6830 Body mass index (BMI) 30.0-30.9, adult: Secondary | ICD-10-CM | POA: Diagnosis not present

## 2018-06-16 DIAGNOSIS — M1990 Unspecified osteoarthritis, unspecified site: Secondary | ICD-10-CM | POA: Diagnosis not present

## 2018-06-25 DIAGNOSIS — R0902 Hypoxemia: Secondary | ICD-10-CM | POA: Diagnosis not present

## 2018-06-25 DIAGNOSIS — G4733 Obstructive sleep apnea (adult) (pediatric): Secondary | ICD-10-CM | POA: Diagnosis not present

## 2018-06-29 DIAGNOSIS — L508 Other urticaria: Secondary | ICD-10-CM | POA: Diagnosis not present

## 2018-06-29 DIAGNOSIS — E785 Hyperlipidemia, unspecified: Secondary | ICD-10-CM | POA: Diagnosis not present

## 2018-06-29 DIAGNOSIS — E871 Hypo-osmolality and hyponatremia: Secondary | ICD-10-CM | POA: Diagnosis not present

## 2018-06-29 DIAGNOSIS — Z23 Encounter for immunization: Secondary | ICD-10-CM | POA: Diagnosis not present

## 2018-06-29 DIAGNOSIS — I509 Heart failure, unspecified: Secondary | ICD-10-CM | POA: Diagnosis not present

## 2018-06-29 DIAGNOSIS — I10 Essential (primary) hypertension: Secondary | ICD-10-CM | POA: Diagnosis not present

## 2018-06-29 DIAGNOSIS — Z6830 Body mass index (BMI) 30.0-30.9, adult: Secondary | ICD-10-CM | POA: Diagnosis not present

## 2018-06-29 DIAGNOSIS — M25561 Pain in right knee: Secondary | ICD-10-CM | POA: Diagnosis not present

## 2018-06-29 DIAGNOSIS — R69 Illness, unspecified: Secondary | ICD-10-CM | POA: Diagnosis not present

## 2018-06-29 DIAGNOSIS — E119 Type 2 diabetes mellitus without complications: Secondary | ICD-10-CM | POA: Diagnosis not present

## 2018-07-10 DIAGNOSIS — R0902 Hypoxemia: Secondary | ICD-10-CM | POA: Diagnosis not present

## 2018-07-10 DIAGNOSIS — G4733 Obstructive sleep apnea (adult) (pediatric): Secondary | ICD-10-CM | POA: Diagnosis not present

## 2018-07-26 DIAGNOSIS — R0902 Hypoxemia: Secondary | ICD-10-CM | POA: Diagnosis not present

## 2018-07-26 DIAGNOSIS — G4733 Obstructive sleep apnea (adult) (pediatric): Secondary | ICD-10-CM | POA: Diagnosis not present

## 2018-08-25 DIAGNOSIS — G4733 Obstructive sleep apnea (adult) (pediatric): Secondary | ICD-10-CM | POA: Diagnosis not present

## 2018-08-25 DIAGNOSIS — R0902 Hypoxemia: Secondary | ICD-10-CM | POA: Diagnosis not present

## 2019-10-11 ENCOUNTER — Other Ambulatory Visit: Payer: Self-pay

## 2019-10-11 NOTE — Patient Outreach (Signed)
[facility]) Care Management  10/11/2019  Stanley Pariseau Jan 14, 1946 [id number]  Medication Adherence call to Mrs. Robley Fries ,patients telephone number belongs to someone else,patient is showing due on Lisinopril 5 mg under [place].  [place] Management Direct Dial [phone]  Fax [phone] [email or web address]

## 2019-11-01 DIAGNOSIS — E871 Hypo-osmolality and hyponatremia: Secondary | ICD-10-CM | POA: Diagnosis not present

## 2019-11-01 DIAGNOSIS — E785 Hyperlipidemia, unspecified: Secondary | ICD-10-CM | POA: Diagnosis not present

## 2019-11-01 DIAGNOSIS — L508 Other urticaria: Secondary | ICD-10-CM | POA: Diagnosis not present

## 2019-11-01 DIAGNOSIS — E1165 Type 2 diabetes mellitus with hyperglycemia: Secondary | ICD-10-CM | POA: Diagnosis not present

## 2019-11-01 DIAGNOSIS — D539 Nutritional anemia, unspecified: Secondary | ICD-10-CM | POA: Diagnosis not present

## 2019-11-01 DIAGNOSIS — I10 Essential (primary) hypertension: Secondary | ICD-10-CM | POA: Diagnosis not present

## 2019-11-16 DIAGNOSIS — Z6824 Body mass index (BMI) 24.0-24.9, adult: Secondary | ICD-10-CM | POA: Diagnosis not present

## 2019-11-16 DIAGNOSIS — R300 Dysuria: Secondary | ICD-10-CM | POA: Diagnosis not present

## 2019-11-16 DIAGNOSIS — N390 Urinary tract infection, site not specified: Secondary | ICD-10-CM | POA: Diagnosis not present

## 2019-11-21 DIAGNOSIS — R0902 Hypoxemia: Secondary | ICD-10-CM | POA: Diagnosis not present

## 2019-11-21 DIAGNOSIS — J449 Chronic obstructive pulmonary disease, unspecified: Secondary | ICD-10-CM | POA: Diagnosis not present

## 2019-11-25 DIAGNOSIS — Z4502 Encounter for adjustment and management of automatic implantable cardiac defibrillator: Secondary | ICD-10-CM | POA: Diagnosis not present

## 2019-11-25 DIAGNOSIS — I420 Dilated cardiomyopathy: Secondary | ICD-10-CM | POA: Diagnosis not present

## 2019-11-25 DIAGNOSIS — Z95810 Presence of automatic (implantable) cardiac defibrillator: Secondary | ICD-10-CM | POA: Diagnosis not present

## 2019-12-13 DIAGNOSIS — I110 Hypertensive heart disease with heart failure: Secondary | ICD-10-CM | POA: Diagnosis not present

## 2019-12-13 DIAGNOSIS — I5022 Chronic systolic (congestive) heart failure: Secondary | ICD-10-CM | POA: Diagnosis not present

## 2019-12-13 DIAGNOSIS — I2510 Atherosclerotic heart disease of native coronary artery without angina pectoris: Secondary | ICD-10-CM | POA: Diagnosis not present

## 2019-12-13 DIAGNOSIS — E871 Hypo-osmolality and hyponatremia: Secondary | ICD-10-CM | POA: Diagnosis not present

## 2019-12-13 DIAGNOSIS — I340 Nonrheumatic mitral (valve) insufficiency: Secondary | ICD-10-CM | POA: Diagnosis not present

## 2019-12-13 DIAGNOSIS — I420 Dilated cardiomyopathy: Secondary | ICD-10-CM | POA: Diagnosis not present

## 2019-12-13 DIAGNOSIS — R531 Weakness: Secondary | ICD-10-CM | POA: Diagnosis not present

## 2019-12-13 DIAGNOSIS — I509 Heart failure, unspecified: Secondary | ICD-10-CM | POA: Diagnosis not present

## 2019-12-13 DIAGNOSIS — M7989 Other specified soft tissue disorders: Secondary | ICD-10-CM | POA: Diagnosis not present

## 2019-12-20 DIAGNOSIS — R600 Localized edema: Secondary | ICD-10-CM | POA: Diagnosis not present

## 2019-12-20 DIAGNOSIS — R531 Weakness: Secondary | ICD-10-CM | POA: Diagnosis not present

## 2019-12-20 DIAGNOSIS — S3992XA Unspecified injury of lower back, initial encounter: Secondary | ICD-10-CM | POA: Diagnosis not present

## 2019-12-20 DIAGNOSIS — M545 Low back pain: Secondary | ICD-10-CM | POA: Diagnosis not present

## 2019-12-20 DIAGNOSIS — M47816 Spondylosis without myelopathy or radiculopathy, lumbar region: Secondary | ICD-10-CM | POA: Diagnosis not present

## 2019-12-20 DIAGNOSIS — S299XXA Unspecified injury of thorax, initial encounter: Secondary | ICD-10-CM | POA: Diagnosis not present

## 2019-12-20 DIAGNOSIS — M546 Pain in thoracic spine: Secondary | ICD-10-CM | POA: Diagnosis not present

## 2019-12-20 DIAGNOSIS — R7989 Other specified abnormal findings of blood chemistry: Secondary | ICD-10-CM | POA: Diagnosis not present

## 2019-12-22 DIAGNOSIS — J449 Chronic obstructive pulmonary disease, unspecified: Secondary | ICD-10-CM | POA: Diagnosis not present

## 2019-12-22 DIAGNOSIS — R0902 Hypoxemia: Secondary | ICD-10-CM | POA: Diagnosis not present

## 2020-01-10 DIAGNOSIS — D539 Nutritional anemia, unspecified: Secondary | ICD-10-CM | POA: Diagnosis not present

## 2020-01-10 DIAGNOSIS — R531 Weakness: Secondary | ICD-10-CM | POA: Diagnosis not present

## 2020-01-10 DIAGNOSIS — I509 Heart failure, unspecified: Secondary | ICD-10-CM | POA: Diagnosis not present

## 2020-01-10 DIAGNOSIS — R600 Localized edema: Secondary | ICD-10-CM | POA: Diagnosis not present

## 2020-01-10 DIAGNOSIS — I429 Cardiomyopathy, unspecified: Secondary | ICD-10-CM | POA: Diagnosis not present

## 2020-01-14 DIAGNOSIS — N390 Urinary tract infection, site not specified: Secondary | ICD-10-CM | POA: Diagnosis not present

## 2020-01-14 DIAGNOSIS — I340 Nonrheumatic mitral (valve) insufficiency: Secondary | ICD-10-CM | POA: Diagnosis not present

## 2020-01-14 DIAGNOSIS — Z95810 Presence of automatic (implantable) cardiac defibrillator: Secondary | ICD-10-CM | POA: Diagnosis not present

## 2020-01-14 DIAGNOSIS — R300 Dysuria: Secondary | ICD-10-CM | POA: Diagnosis not present

## 2020-01-14 DIAGNOSIS — I2510 Atherosclerotic heart disease of native coronary artery without angina pectoris: Secondary | ICD-10-CM | POA: Diagnosis not present

## 2020-01-14 DIAGNOSIS — I420 Dilated cardiomyopathy: Secondary | ICD-10-CM | POA: Diagnosis not present

## 2020-01-14 DIAGNOSIS — Z6824 Body mass index (BMI) 24.0-24.9, adult: Secondary | ICD-10-CM | POA: Diagnosis not present

## 2020-01-14 DIAGNOSIS — I10 Essential (primary) hypertension: Secondary | ICD-10-CM | POA: Diagnosis not present

## 2020-01-20 DIAGNOSIS — I420 Dilated cardiomyopathy: Secondary | ICD-10-CM | POA: Diagnosis not present

## 2020-01-21 DIAGNOSIS — R0902 Hypoxemia: Secondary | ICD-10-CM | POA: Diagnosis not present

## 2020-01-21 DIAGNOSIS — J449 Chronic obstructive pulmonary disease, unspecified: Secondary | ICD-10-CM | POA: Diagnosis not present

## 2020-01-28 DIAGNOSIS — I429 Cardiomyopathy, unspecified: Secondary | ICD-10-CM | POA: Diagnosis not present

## 2020-01-28 DIAGNOSIS — E876 Hypokalemia: Secondary | ICD-10-CM | POA: Diagnosis not present

## 2020-01-28 DIAGNOSIS — I509 Heart failure, unspecified: Secondary | ICD-10-CM | POA: Diagnosis not present

## 2020-01-28 DIAGNOSIS — R609 Edema, unspecified: Secondary | ICD-10-CM | POA: Diagnosis not present

## 2020-02-01 DIAGNOSIS — Z1231 Encounter for screening mammogram for malignant neoplasm of breast: Secondary | ICD-10-CM | POA: Diagnosis not present

## 2020-02-08 DIAGNOSIS — Z6824 Body mass index (BMI) 24.0-24.9, adult: Secondary | ICD-10-CM | POA: Diagnosis not present

## 2020-02-08 DIAGNOSIS — I429 Cardiomyopathy, unspecified: Secondary | ICD-10-CM | POA: Diagnosis not present

## 2020-02-08 DIAGNOSIS — R609 Edema, unspecified: Secondary | ICD-10-CM | POA: Diagnosis not present

## 2020-02-08 DIAGNOSIS — I509 Heart failure, unspecified: Secondary | ICD-10-CM | POA: Diagnosis not present

## 2020-02-14 DIAGNOSIS — I10 Essential (primary) hypertension: Secondary | ICD-10-CM | POA: Diagnosis not present

## 2020-02-17 DIAGNOSIS — Z9181 History of falling: Secondary | ICD-10-CM | POA: Diagnosis not present

## 2020-02-17 DIAGNOSIS — E785 Hyperlipidemia, unspecified: Secondary | ICD-10-CM | POA: Diagnosis not present

## 2020-02-17 DIAGNOSIS — Z139 Encounter for screening, unspecified: Secondary | ICD-10-CM | POA: Diagnosis not present

## 2020-02-17 DIAGNOSIS — Z0000 Encounter for general adult medical examination without abnormal findings: Secondary | ICD-10-CM | POA: Diagnosis not present

## 2020-02-21 DIAGNOSIS — R0902 Hypoxemia: Secondary | ICD-10-CM | POA: Diagnosis not present

## 2020-02-21 DIAGNOSIS — J449 Chronic obstructive pulmonary disease, unspecified: Secondary | ICD-10-CM | POA: Diagnosis not present

## 2020-02-22 DIAGNOSIS — I509 Heart failure, unspecified: Secondary | ICD-10-CM | POA: Diagnosis not present

## 2020-02-22 DIAGNOSIS — Z6822 Body mass index (BMI) 22.0-22.9, adult: Secondary | ICD-10-CM | POA: Diagnosis not present

## 2020-02-22 DIAGNOSIS — R609 Edema, unspecified: Secondary | ICD-10-CM | POA: Diagnosis not present

## 2020-02-22 DIAGNOSIS — I429 Cardiomyopathy, unspecified: Secondary | ICD-10-CM | POA: Diagnosis not present

## 2020-03-22 DIAGNOSIS — R0902 Hypoxemia: Secondary | ICD-10-CM | POA: Diagnosis not present

## 2020-03-22 DIAGNOSIS — J449 Chronic obstructive pulmonary disease, unspecified: Secondary | ICD-10-CM | POA: Diagnosis not present

## 2020-03-23 DIAGNOSIS — R609 Edema, unspecified: Secondary | ICD-10-CM | POA: Diagnosis not present

## 2020-03-23 DIAGNOSIS — I509 Heart failure, unspecified: Secondary | ICD-10-CM | POA: Diagnosis not present

## 2020-03-23 DIAGNOSIS — I429 Cardiomyopathy, unspecified: Secondary | ICD-10-CM | POA: Diagnosis not present

## 2020-03-23 DIAGNOSIS — Z6823 Body mass index (BMI) 23.0-23.9, adult: Secondary | ICD-10-CM | POA: Diagnosis not present

## 2020-03-23 DIAGNOSIS — E1165 Type 2 diabetes mellitus with hyperglycemia: Secondary | ICD-10-CM | POA: Diagnosis not present

## 2020-04-22 DIAGNOSIS — R0902 Hypoxemia: Secondary | ICD-10-CM | POA: Diagnosis not present

## 2020-04-22 DIAGNOSIS — J449 Chronic obstructive pulmonary disease, unspecified: Secondary | ICD-10-CM | POA: Diagnosis not present

## 2020-05-05 DIAGNOSIS — R600 Localized edema: Secondary | ICD-10-CM | POA: Diagnosis not present

## 2020-05-23 DIAGNOSIS — J449 Chronic obstructive pulmonary disease, unspecified: Secondary | ICD-10-CM | POA: Diagnosis not present

## 2020-05-23 DIAGNOSIS — R0902 Hypoxemia: Secondary | ICD-10-CM | POA: Diagnosis not present

## 2020-05-25 DIAGNOSIS — I509 Heart failure, unspecified: Secondary | ICD-10-CM | POA: Diagnosis not present

## 2020-05-25 DIAGNOSIS — I82B12 Acute embolism and thrombosis of left subclavian vein: Secondary | ICD-10-CM | POA: Diagnosis not present

## 2020-05-25 DIAGNOSIS — J9811 Atelectasis: Secondary | ICD-10-CM | POA: Diagnosis not present

## 2020-05-25 DIAGNOSIS — I82B22 Chronic embolism and thrombosis of left subclavian vein: Secondary | ICD-10-CM | POA: Diagnosis not present

## 2020-05-25 DIAGNOSIS — I110 Hypertensive heart disease with heart failure: Secondary | ICD-10-CM | POA: Diagnosis not present

## 2020-05-25 DIAGNOSIS — N179 Acute kidney failure, unspecified: Secondary | ICD-10-CM | POA: Diagnosis not present

## 2020-05-25 DIAGNOSIS — J90 Pleural effusion, not elsewhere classified: Secondary | ICD-10-CM | POA: Diagnosis not present

## 2020-05-26 DIAGNOSIS — E785 Hyperlipidemia, unspecified: Secondary | ICD-10-CM | POA: Diagnosis not present

## 2020-05-26 DIAGNOSIS — N179 Acute kidney failure, unspecified: Secondary | ICD-10-CM | POA: Diagnosis not present

## 2020-05-26 DIAGNOSIS — J90 Pleural effusion, not elsewhere classified: Secondary | ICD-10-CM | POA: Diagnosis not present

## 2020-05-26 DIAGNOSIS — E1122 Type 2 diabetes mellitus with diabetic chronic kidney disease: Secondary | ICD-10-CM | POA: Diagnosis not present

## 2020-05-26 DIAGNOSIS — R310 Gross hematuria: Secondary | ICD-10-CM | POA: Diagnosis not present

## 2020-05-26 DIAGNOSIS — Z882 Allergy status to sulfonamides status: Secondary | ICD-10-CM | POA: Diagnosis not present

## 2020-05-26 DIAGNOSIS — I509 Heart failure, unspecified: Secondary | ICD-10-CM | POA: Diagnosis not present

## 2020-05-26 DIAGNOSIS — N170 Acute kidney failure with tubular necrosis: Secondary | ICD-10-CM | POA: Diagnosis not present

## 2020-05-26 DIAGNOSIS — I82409 Acute embolism and thrombosis of unspecified deep veins of unspecified lower extremity: Secondary | ICD-10-CM | POA: Diagnosis not present

## 2020-05-26 DIAGNOSIS — M1990 Unspecified osteoarthritis, unspecified site: Secondary | ICD-10-CM | POA: Diagnosis not present

## 2020-05-26 DIAGNOSIS — I82B22 Chronic embolism and thrombosis of left subclavian vein: Secondary | ICD-10-CM | POA: Diagnosis not present

## 2020-05-26 DIAGNOSIS — Z7984 Long term (current) use of oral hypoglycemic drugs: Secondary | ICD-10-CM | POA: Diagnosis not present

## 2020-05-26 DIAGNOSIS — I2510 Atherosclerotic heart disease of native coronary artery without angina pectoris: Secondary | ICD-10-CM | POA: Diagnosis not present

## 2020-05-26 DIAGNOSIS — I429 Cardiomyopathy, unspecified: Secondary | ICD-10-CM | POA: Diagnosis not present

## 2020-05-26 DIAGNOSIS — Z7982 Long term (current) use of aspirin: Secondary | ICD-10-CM | POA: Diagnosis not present

## 2020-05-26 DIAGNOSIS — I739 Peripheral vascular disease, unspecified: Secondary | ICD-10-CM | POA: Diagnosis not present

## 2020-05-26 DIAGNOSIS — I110 Hypertensive heart disease with heart failure: Secondary | ICD-10-CM | POA: Diagnosis not present

## 2020-05-26 DIAGNOSIS — J45909 Unspecified asthma, uncomplicated: Secondary | ICD-10-CM | POA: Diagnosis not present

## 2020-05-26 DIAGNOSIS — I5023 Acute on chronic systolic (congestive) heart failure: Secondary | ICD-10-CM | POA: Diagnosis not present

## 2020-05-26 DIAGNOSIS — Z0389 Encounter for observation for other suspected diseases and conditions ruled out: Secondary | ICD-10-CM | POA: Diagnosis not present

## 2020-05-26 DIAGNOSIS — Z95810 Presence of automatic (implantable) cardiac defibrillator: Secondary | ICD-10-CM | POA: Diagnosis not present

## 2020-05-26 DIAGNOSIS — I130 Hypertensive heart and chronic kidney disease with heart failure and stage 1 through stage 4 chronic kidney disease, or unspecified chronic kidney disease: Secondary | ICD-10-CM | POA: Diagnosis not present

## 2020-05-26 DIAGNOSIS — K219 Gastro-esophageal reflux disease without esophagitis: Secondary | ICD-10-CM | POA: Diagnosis not present

## 2020-05-26 DIAGNOSIS — N183 Chronic kidney disease, stage 3 unspecified: Secondary | ICD-10-CM | POA: Diagnosis not present

## 2020-05-26 DIAGNOSIS — E119 Type 2 diabetes mellitus without complications: Secondary | ICD-10-CM | POA: Diagnosis not present

## 2020-05-26 DIAGNOSIS — R319 Hematuria, unspecified: Secondary | ICD-10-CM | POA: Diagnosis not present

## 2020-05-26 DIAGNOSIS — E875 Hyperkalemia: Secondary | ICD-10-CM | POA: Diagnosis not present

## 2020-05-26 DIAGNOSIS — I361 Nonrheumatic tricuspid (valve) insufficiency: Secondary | ICD-10-CM | POA: Diagnosis not present

## 2020-05-26 DIAGNOSIS — I340 Nonrheumatic mitral (valve) insufficiency: Secondary | ICD-10-CM | POA: Diagnosis not present

## 2020-05-26 DIAGNOSIS — I10 Essential (primary) hypertension: Secondary | ICD-10-CM | POA: Diagnosis not present

## 2020-05-26 DIAGNOSIS — Z87891 Personal history of nicotine dependence: Secondary | ICD-10-CM | POA: Diagnosis not present

## 2020-05-26 DIAGNOSIS — Z79899 Other long term (current) drug therapy: Secondary | ICD-10-CM | POA: Diagnosis not present

## 2020-05-26 DIAGNOSIS — I82B12 Acute embolism and thrombosis of left subclavian vein: Secondary | ICD-10-CM | POA: Diagnosis not present

## 2020-05-26 DIAGNOSIS — J9811 Atelectasis: Secondary | ICD-10-CM | POA: Diagnosis not present

## 2020-05-26 DIAGNOSIS — I420 Dilated cardiomyopathy: Secondary | ICD-10-CM | POA: Diagnosis not present

## 2020-05-26 DIAGNOSIS — R188 Other ascites: Secondary | ICD-10-CM | POA: Diagnosis not present

## 2020-05-27 DIAGNOSIS — I5023 Acute on chronic systolic (congestive) heart failure: Secondary | ICD-10-CM

## 2020-05-27 DIAGNOSIS — I429 Cardiomyopathy, unspecified: Secondary | ICD-10-CM

## 2020-05-27 DIAGNOSIS — E119 Type 2 diabetes mellitus without complications: Secondary | ICD-10-CM | POA: Diagnosis not present

## 2020-05-27 DIAGNOSIS — I10 Essential (primary) hypertension: Secondary | ICD-10-CM

## 2020-05-27 DIAGNOSIS — I2510 Atherosclerotic heart disease of native coronary artery without angina pectoris: Secondary | ICD-10-CM | POA: Diagnosis not present

## 2020-05-28 DIAGNOSIS — I429 Cardiomyopathy, unspecified: Secondary | ICD-10-CM | POA: Diagnosis not present

## 2020-05-28 DIAGNOSIS — I5023 Acute on chronic systolic (congestive) heart failure: Secondary | ICD-10-CM | POA: Diagnosis not present

## 2020-05-28 DIAGNOSIS — E119 Type 2 diabetes mellitus without complications: Secondary | ICD-10-CM | POA: Diagnosis not present

## 2020-05-28 DIAGNOSIS — I10 Essential (primary) hypertension: Secondary | ICD-10-CM | POA: Diagnosis not present

## 2020-05-29 DIAGNOSIS — I10 Essential (primary) hypertension: Secondary | ICD-10-CM | POA: Diagnosis not present

## 2020-05-29 DIAGNOSIS — E119 Type 2 diabetes mellitus without complications: Secondary | ICD-10-CM | POA: Diagnosis not present

## 2020-05-29 DIAGNOSIS — I5023 Acute on chronic systolic (congestive) heart failure: Secondary | ICD-10-CM | POA: Diagnosis not present

## 2020-05-29 DIAGNOSIS — I429 Cardiomyopathy, unspecified: Secondary | ICD-10-CM | POA: Diagnosis not present

## 2020-05-30 DIAGNOSIS — I82409 Acute embolism and thrombosis of unspecified deep veins of unspecified lower extremity: Secondary | ICD-10-CM

## 2020-05-30 DIAGNOSIS — I5023 Acute on chronic systolic (congestive) heart failure: Secondary | ICD-10-CM | POA: Diagnosis not present

## 2020-05-30 DIAGNOSIS — I10 Essential (primary) hypertension: Secondary | ICD-10-CM | POA: Diagnosis not present

## 2020-05-30 DIAGNOSIS — I429 Cardiomyopathy, unspecified: Secondary | ICD-10-CM | POA: Diagnosis not present

## 2020-05-30 DIAGNOSIS — E119 Type 2 diabetes mellitus without complications: Secondary | ICD-10-CM | POA: Diagnosis not present

## 2020-05-31 DIAGNOSIS — I5023 Acute on chronic systolic (congestive) heart failure: Secondary | ICD-10-CM | POA: Diagnosis not present

## 2020-05-31 DIAGNOSIS — I10 Essential (primary) hypertension: Secondary | ICD-10-CM | POA: Diagnosis not present

## 2020-05-31 DIAGNOSIS — E119 Type 2 diabetes mellitus without complications: Secondary | ICD-10-CM | POA: Diagnosis not present

## 2020-05-31 DIAGNOSIS — I429 Cardiomyopathy, unspecified: Secondary | ICD-10-CM | POA: Diagnosis not present

## 2020-06-01 DIAGNOSIS — I82B12 Acute embolism and thrombosis of left subclavian vein: Secondary | ICD-10-CM | POA: Diagnosis not present

## 2020-06-01 DIAGNOSIS — Z7951 Long term (current) use of inhaled steroids: Secondary | ICD-10-CM | POA: Diagnosis not present

## 2020-06-01 DIAGNOSIS — N183 Chronic kidney disease, stage 3 unspecified: Secondary | ICD-10-CM | POA: Diagnosis not present

## 2020-06-01 DIAGNOSIS — Z9181 History of falling: Secondary | ICD-10-CM | POA: Diagnosis not present

## 2020-06-01 DIAGNOSIS — Z7982 Long term (current) use of aspirin: Secondary | ICD-10-CM | POA: Diagnosis not present

## 2020-06-01 DIAGNOSIS — E875 Hyperkalemia: Secondary | ICD-10-CM | POA: Diagnosis not present

## 2020-06-01 DIAGNOSIS — J45909 Unspecified asthma, uncomplicated: Secondary | ICD-10-CM | POA: Diagnosis not present

## 2020-06-01 DIAGNOSIS — I5023 Acute on chronic systolic (congestive) heart failure: Secondary | ICD-10-CM | POA: Diagnosis not present

## 2020-06-01 DIAGNOSIS — I429 Cardiomyopathy, unspecified: Secondary | ICD-10-CM | POA: Diagnosis not present

## 2020-06-01 DIAGNOSIS — E785 Hyperlipidemia, unspecified: Secondary | ICD-10-CM | POA: Diagnosis not present

## 2020-06-01 DIAGNOSIS — E1151 Type 2 diabetes mellitus with diabetic peripheral angiopathy without gangrene: Secondary | ICD-10-CM | POA: Diagnosis not present

## 2020-06-01 DIAGNOSIS — Z95810 Presence of automatic (implantable) cardiac defibrillator: Secondary | ICD-10-CM | POA: Diagnosis not present

## 2020-06-01 DIAGNOSIS — Z79891 Long term (current) use of opiate analgesic: Secondary | ICD-10-CM | POA: Diagnosis not present

## 2020-06-01 DIAGNOSIS — K5790 Diverticulosis of intestine, part unspecified, without perforation or abscess without bleeding: Secondary | ICD-10-CM | POA: Diagnosis not present

## 2020-06-01 DIAGNOSIS — M1991 Primary osteoarthritis, unspecified site: Secondary | ICD-10-CM | POA: Diagnosis not present

## 2020-06-01 DIAGNOSIS — I130 Hypertensive heart and chronic kidney disease with heart failure and stage 1 through stage 4 chronic kidney disease, or unspecified chronic kidney disease: Secondary | ICD-10-CM | POA: Diagnosis not present

## 2020-06-01 DIAGNOSIS — Z7984 Long term (current) use of oral hypoglycemic drugs: Secondary | ICD-10-CM | POA: Diagnosis not present

## 2020-06-01 DIAGNOSIS — E1122 Type 2 diabetes mellitus with diabetic chronic kidney disease: Secondary | ICD-10-CM | POA: Diagnosis not present

## 2020-06-04 DIAGNOSIS — E875 Hyperkalemia: Secondary | ICD-10-CM | POA: Diagnosis not present

## 2020-06-04 DIAGNOSIS — E1122 Type 2 diabetes mellitus with diabetic chronic kidney disease: Secondary | ICD-10-CM | POA: Diagnosis not present

## 2020-06-04 DIAGNOSIS — E785 Hyperlipidemia, unspecified: Secondary | ICD-10-CM | POA: Diagnosis not present

## 2020-06-04 DIAGNOSIS — I429 Cardiomyopathy, unspecified: Secondary | ICD-10-CM | POA: Diagnosis not present

## 2020-06-04 DIAGNOSIS — Z7951 Long term (current) use of inhaled steroids: Secondary | ICD-10-CM | POA: Diagnosis not present

## 2020-06-04 DIAGNOSIS — I5023 Acute on chronic systolic (congestive) heart failure: Secondary | ICD-10-CM | POA: Diagnosis not present

## 2020-06-04 DIAGNOSIS — Z7984 Long term (current) use of oral hypoglycemic drugs: Secondary | ICD-10-CM | POA: Diagnosis not present

## 2020-06-04 DIAGNOSIS — K5790 Diverticulosis of intestine, part unspecified, without perforation or abscess without bleeding: Secondary | ICD-10-CM | POA: Diagnosis not present

## 2020-06-04 DIAGNOSIS — E1151 Type 2 diabetes mellitus with diabetic peripheral angiopathy without gangrene: Secondary | ICD-10-CM | POA: Diagnosis not present

## 2020-06-04 DIAGNOSIS — N183 Chronic kidney disease, stage 3 unspecified: Secondary | ICD-10-CM | POA: Diagnosis not present

## 2020-06-04 DIAGNOSIS — I130 Hypertensive heart and chronic kidney disease with heart failure and stage 1 through stage 4 chronic kidney disease, or unspecified chronic kidney disease: Secondary | ICD-10-CM | POA: Diagnosis not present

## 2020-06-04 DIAGNOSIS — J45909 Unspecified asthma, uncomplicated: Secondary | ICD-10-CM | POA: Diagnosis not present

## 2020-06-04 DIAGNOSIS — M1991 Primary osteoarthritis, unspecified site: Secondary | ICD-10-CM | POA: Diagnosis not present

## 2020-06-04 DIAGNOSIS — Z79891 Long term (current) use of opiate analgesic: Secondary | ICD-10-CM | POA: Diagnosis not present

## 2020-06-04 DIAGNOSIS — I82B12 Acute embolism and thrombosis of left subclavian vein: Secondary | ICD-10-CM | POA: Diagnosis not present

## 2020-06-04 DIAGNOSIS — Z95810 Presence of automatic (implantable) cardiac defibrillator: Secondary | ICD-10-CM | POA: Diagnosis not present

## 2020-06-04 DIAGNOSIS — Z7982 Long term (current) use of aspirin: Secondary | ICD-10-CM | POA: Diagnosis not present

## 2020-06-04 DIAGNOSIS — Z9181 History of falling: Secondary | ICD-10-CM | POA: Diagnosis not present

## 2020-06-05 DIAGNOSIS — K5790 Diverticulosis of intestine, part unspecified, without perforation or abscess without bleeding: Secondary | ICD-10-CM | POA: Diagnosis not present

## 2020-06-05 DIAGNOSIS — Z7951 Long term (current) use of inhaled steroids: Secondary | ICD-10-CM | POA: Diagnosis not present

## 2020-06-05 DIAGNOSIS — Z7984 Long term (current) use of oral hypoglycemic drugs: Secondary | ICD-10-CM | POA: Diagnosis not present

## 2020-06-05 DIAGNOSIS — N183 Chronic kidney disease, stage 3 unspecified: Secondary | ICD-10-CM | POA: Diagnosis not present

## 2020-06-05 DIAGNOSIS — Z95810 Presence of automatic (implantable) cardiac defibrillator: Secondary | ICD-10-CM | POA: Diagnosis not present

## 2020-06-05 DIAGNOSIS — E785 Hyperlipidemia, unspecified: Secondary | ICD-10-CM | POA: Diagnosis not present

## 2020-06-05 DIAGNOSIS — J45909 Unspecified asthma, uncomplicated: Secondary | ICD-10-CM | POA: Diagnosis not present

## 2020-06-05 DIAGNOSIS — M1991 Primary osteoarthritis, unspecified site: Secondary | ICD-10-CM | POA: Diagnosis not present

## 2020-06-05 DIAGNOSIS — E875 Hyperkalemia: Secondary | ICD-10-CM | POA: Diagnosis not present

## 2020-06-05 DIAGNOSIS — I82B12 Acute embolism and thrombosis of left subclavian vein: Secondary | ICD-10-CM | POA: Diagnosis not present

## 2020-06-05 DIAGNOSIS — I5043 Acute on chronic combined systolic (congestive) and diastolic (congestive) heart failure: Secondary | ICD-10-CM | POA: Diagnosis not present

## 2020-06-05 DIAGNOSIS — E1122 Type 2 diabetes mellitus with diabetic chronic kidney disease: Secondary | ICD-10-CM | POA: Diagnosis not present

## 2020-06-05 DIAGNOSIS — Z7982 Long term (current) use of aspirin: Secondary | ICD-10-CM | POA: Diagnosis not present

## 2020-06-05 DIAGNOSIS — I130 Hypertensive heart and chronic kidney disease with heart failure and stage 1 through stage 4 chronic kidney disease, or unspecified chronic kidney disease: Secondary | ICD-10-CM | POA: Diagnosis not present

## 2020-06-05 DIAGNOSIS — E1151 Type 2 diabetes mellitus with diabetic peripheral angiopathy without gangrene: Secondary | ICD-10-CM | POA: Diagnosis not present

## 2020-06-05 DIAGNOSIS — I429 Cardiomyopathy, unspecified: Secondary | ICD-10-CM | POA: Diagnosis not present

## 2020-06-05 DIAGNOSIS — Z79891 Long term (current) use of opiate analgesic: Secondary | ICD-10-CM | POA: Diagnosis not present

## 2020-06-05 DIAGNOSIS — I5023 Acute on chronic systolic (congestive) heart failure: Secondary | ICD-10-CM | POA: Diagnosis not present

## 2020-06-05 DIAGNOSIS — Z9181 History of falling: Secondary | ICD-10-CM | POA: Diagnosis not present

## 2020-06-08 DIAGNOSIS — R519 Headache, unspecified: Secondary | ICD-10-CM | POA: Diagnosis not present

## 2020-06-09 DIAGNOSIS — Z79891 Long term (current) use of opiate analgesic: Secondary | ICD-10-CM | POA: Diagnosis not present

## 2020-06-09 DIAGNOSIS — I130 Hypertensive heart and chronic kidney disease with heart failure and stage 1 through stage 4 chronic kidney disease, or unspecified chronic kidney disease: Secondary | ICD-10-CM | POA: Diagnosis not present

## 2020-06-09 DIAGNOSIS — E785 Hyperlipidemia, unspecified: Secondary | ICD-10-CM | POA: Diagnosis not present

## 2020-06-09 DIAGNOSIS — E1151 Type 2 diabetes mellitus with diabetic peripheral angiopathy without gangrene: Secondary | ICD-10-CM | POA: Diagnosis not present

## 2020-06-09 DIAGNOSIS — N183 Chronic kidney disease, stage 3 unspecified: Secondary | ICD-10-CM | POA: Diagnosis not present

## 2020-06-09 DIAGNOSIS — Z7984 Long term (current) use of oral hypoglycemic drugs: Secondary | ICD-10-CM | POA: Diagnosis not present

## 2020-06-09 DIAGNOSIS — I429 Cardiomyopathy, unspecified: Secondary | ICD-10-CM | POA: Diagnosis not present

## 2020-06-09 DIAGNOSIS — E875 Hyperkalemia: Secondary | ICD-10-CM | POA: Diagnosis not present

## 2020-06-09 DIAGNOSIS — I82B12 Acute embolism and thrombosis of left subclavian vein: Secondary | ICD-10-CM | POA: Diagnosis not present

## 2020-06-09 DIAGNOSIS — E1122 Type 2 diabetes mellitus with diabetic chronic kidney disease: Secondary | ICD-10-CM | POA: Diagnosis not present

## 2020-06-09 DIAGNOSIS — Z7951 Long term (current) use of inhaled steroids: Secondary | ICD-10-CM | POA: Diagnosis not present

## 2020-06-09 DIAGNOSIS — Z95810 Presence of automatic (implantable) cardiac defibrillator: Secondary | ICD-10-CM | POA: Diagnosis not present

## 2020-06-09 DIAGNOSIS — K5790 Diverticulosis of intestine, part unspecified, without perforation or abscess without bleeding: Secondary | ICD-10-CM | POA: Diagnosis not present

## 2020-06-09 DIAGNOSIS — J45909 Unspecified asthma, uncomplicated: Secondary | ICD-10-CM | POA: Diagnosis not present

## 2020-06-09 DIAGNOSIS — I5023 Acute on chronic systolic (congestive) heart failure: Secondary | ICD-10-CM | POA: Diagnosis not present

## 2020-06-09 DIAGNOSIS — M1991 Primary osteoarthritis, unspecified site: Secondary | ICD-10-CM | POA: Diagnosis not present

## 2020-06-09 DIAGNOSIS — Z7982 Long term (current) use of aspirin: Secondary | ICD-10-CM | POA: Diagnosis not present

## 2020-06-09 DIAGNOSIS — Z9181 History of falling: Secondary | ICD-10-CM | POA: Diagnosis not present

## 2020-06-13 DIAGNOSIS — N183 Chronic kidney disease, stage 3 unspecified: Secondary | ICD-10-CM | POA: Diagnosis not present

## 2020-06-13 DIAGNOSIS — I130 Hypertensive heart and chronic kidney disease with heart failure and stage 1 through stage 4 chronic kidney disease, or unspecified chronic kidney disease: Secondary | ICD-10-CM | POA: Diagnosis not present

## 2020-06-13 DIAGNOSIS — K5790 Diverticulosis of intestine, part unspecified, without perforation or abscess without bleeding: Secondary | ICD-10-CM | POA: Diagnosis not present

## 2020-06-13 DIAGNOSIS — Z9181 History of falling: Secondary | ICD-10-CM | POA: Diagnosis not present

## 2020-06-13 DIAGNOSIS — E875 Hyperkalemia: Secondary | ICD-10-CM | POA: Diagnosis not present

## 2020-06-13 DIAGNOSIS — Z79891 Long term (current) use of opiate analgesic: Secondary | ICD-10-CM | POA: Diagnosis not present

## 2020-06-13 DIAGNOSIS — I5023 Acute on chronic systolic (congestive) heart failure: Secondary | ICD-10-CM | POA: Diagnosis not present

## 2020-06-13 DIAGNOSIS — M1991 Primary osteoarthritis, unspecified site: Secondary | ICD-10-CM | POA: Diagnosis not present

## 2020-06-13 DIAGNOSIS — J45909 Unspecified asthma, uncomplicated: Secondary | ICD-10-CM | POA: Diagnosis not present

## 2020-06-13 DIAGNOSIS — Z7984 Long term (current) use of oral hypoglycemic drugs: Secondary | ICD-10-CM | POA: Diagnosis not present

## 2020-06-13 DIAGNOSIS — Z95810 Presence of automatic (implantable) cardiac defibrillator: Secondary | ICD-10-CM | POA: Diagnosis not present

## 2020-06-13 DIAGNOSIS — E1122 Type 2 diabetes mellitus with diabetic chronic kidney disease: Secondary | ICD-10-CM | POA: Diagnosis not present

## 2020-06-13 DIAGNOSIS — I429 Cardiomyopathy, unspecified: Secondary | ICD-10-CM | POA: Diagnosis not present

## 2020-06-13 DIAGNOSIS — E1151 Type 2 diabetes mellitus with diabetic peripheral angiopathy without gangrene: Secondary | ICD-10-CM | POA: Diagnosis not present

## 2020-06-13 DIAGNOSIS — Z7951 Long term (current) use of inhaled steroids: Secondary | ICD-10-CM | POA: Diagnosis not present

## 2020-06-13 DIAGNOSIS — E785 Hyperlipidemia, unspecified: Secondary | ICD-10-CM | POA: Diagnosis not present

## 2020-06-13 DIAGNOSIS — I82B12 Acute embolism and thrombosis of left subclavian vein: Secondary | ICD-10-CM | POA: Diagnosis not present

## 2020-06-13 DIAGNOSIS — Z7982 Long term (current) use of aspirin: Secondary | ICD-10-CM | POA: Diagnosis not present

## 2020-06-14 DIAGNOSIS — I5023 Acute on chronic systolic (congestive) heart failure: Secondary | ICD-10-CM | POA: Diagnosis not present

## 2020-06-14 DIAGNOSIS — Z9181 History of falling: Secondary | ICD-10-CM | POA: Diagnosis not present

## 2020-06-14 DIAGNOSIS — E875 Hyperkalemia: Secondary | ICD-10-CM | POA: Diagnosis not present

## 2020-06-14 DIAGNOSIS — Z7951 Long term (current) use of inhaled steroids: Secondary | ICD-10-CM | POA: Diagnosis not present

## 2020-06-14 DIAGNOSIS — E1151 Type 2 diabetes mellitus with diabetic peripheral angiopathy without gangrene: Secondary | ICD-10-CM | POA: Diagnosis not present

## 2020-06-14 DIAGNOSIS — Z7984 Long term (current) use of oral hypoglycemic drugs: Secondary | ICD-10-CM | POA: Diagnosis not present

## 2020-06-14 DIAGNOSIS — I429 Cardiomyopathy, unspecified: Secondary | ICD-10-CM | POA: Diagnosis not present

## 2020-06-14 DIAGNOSIS — Z95810 Presence of automatic (implantable) cardiac defibrillator: Secondary | ICD-10-CM | POA: Diagnosis not present

## 2020-06-14 DIAGNOSIS — I130 Hypertensive heart and chronic kidney disease with heart failure and stage 1 through stage 4 chronic kidney disease, or unspecified chronic kidney disease: Secondary | ICD-10-CM | POA: Diagnosis not present

## 2020-06-14 DIAGNOSIS — J45909 Unspecified asthma, uncomplicated: Secondary | ICD-10-CM | POA: Diagnosis not present

## 2020-06-14 DIAGNOSIS — Z7982 Long term (current) use of aspirin: Secondary | ICD-10-CM | POA: Diagnosis not present

## 2020-06-14 DIAGNOSIS — K5790 Diverticulosis of intestine, part unspecified, without perforation or abscess without bleeding: Secondary | ICD-10-CM | POA: Diagnosis not present

## 2020-06-14 DIAGNOSIS — E1122 Type 2 diabetes mellitus with diabetic chronic kidney disease: Secondary | ICD-10-CM | POA: Diagnosis not present

## 2020-06-14 DIAGNOSIS — N183 Chronic kidney disease, stage 3 unspecified: Secondary | ICD-10-CM | POA: Diagnosis not present

## 2020-06-14 DIAGNOSIS — I82B12 Acute embolism and thrombosis of left subclavian vein: Secondary | ICD-10-CM | POA: Diagnosis not present

## 2020-06-14 DIAGNOSIS — Z79891 Long term (current) use of opiate analgesic: Secondary | ICD-10-CM | POA: Diagnosis not present

## 2020-06-14 DIAGNOSIS — M1991 Primary osteoarthritis, unspecified site: Secondary | ICD-10-CM | POA: Diagnosis not present

## 2020-06-14 DIAGNOSIS — E785 Hyperlipidemia, unspecified: Secondary | ICD-10-CM | POA: Diagnosis not present

## 2020-06-15 DIAGNOSIS — E1122 Type 2 diabetes mellitus with diabetic chronic kidney disease: Secondary | ICD-10-CM | POA: Diagnosis not present

## 2020-06-15 DIAGNOSIS — E875 Hyperkalemia: Secondary | ICD-10-CM | POA: Diagnosis not present

## 2020-06-15 DIAGNOSIS — Z7984 Long term (current) use of oral hypoglycemic drugs: Secondary | ICD-10-CM | POA: Diagnosis not present

## 2020-06-15 DIAGNOSIS — Z7982 Long term (current) use of aspirin: Secondary | ICD-10-CM | POA: Diagnosis not present

## 2020-06-15 DIAGNOSIS — Z9181 History of falling: Secondary | ICD-10-CM | POA: Diagnosis not present

## 2020-06-15 DIAGNOSIS — Z7951 Long term (current) use of inhaled steroids: Secondary | ICD-10-CM | POA: Diagnosis not present

## 2020-06-15 DIAGNOSIS — E1151 Type 2 diabetes mellitus with diabetic peripheral angiopathy without gangrene: Secondary | ICD-10-CM | POA: Diagnosis not present

## 2020-06-15 DIAGNOSIS — Z79891 Long term (current) use of opiate analgesic: Secondary | ICD-10-CM | POA: Diagnosis not present

## 2020-06-15 DIAGNOSIS — I130 Hypertensive heart and chronic kidney disease with heart failure and stage 1 through stage 4 chronic kidney disease, or unspecified chronic kidney disease: Secondary | ICD-10-CM | POA: Diagnosis not present

## 2020-06-15 DIAGNOSIS — E785 Hyperlipidemia, unspecified: Secondary | ICD-10-CM | POA: Diagnosis not present

## 2020-06-15 DIAGNOSIS — I5023 Acute on chronic systolic (congestive) heart failure: Secondary | ICD-10-CM | POA: Diagnosis not present

## 2020-06-15 DIAGNOSIS — M1991 Primary osteoarthritis, unspecified site: Secondary | ICD-10-CM | POA: Diagnosis not present

## 2020-06-15 DIAGNOSIS — K5790 Diverticulosis of intestine, part unspecified, without perforation or abscess without bleeding: Secondary | ICD-10-CM | POA: Diagnosis not present

## 2020-06-15 DIAGNOSIS — N183 Chronic kidney disease, stage 3 unspecified: Secondary | ICD-10-CM | POA: Diagnosis not present

## 2020-06-15 DIAGNOSIS — I429 Cardiomyopathy, unspecified: Secondary | ICD-10-CM | POA: Diagnosis not present

## 2020-06-15 DIAGNOSIS — Z95810 Presence of automatic (implantable) cardiac defibrillator: Secondary | ICD-10-CM | POA: Diagnosis not present

## 2020-06-15 DIAGNOSIS — J45909 Unspecified asthma, uncomplicated: Secondary | ICD-10-CM | POA: Diagnosis not present

## 2020-06-15 DIAGNOSIS — I82B12 Acute embolism and thrombosis of left subclavian vein: Secondary | ICD-10-CM | POA: Diagnosis not present

## 2020-06-16 DIAGNOSIS — I10 Essential (primary) hypertension: Secondary | ICD-10-CM | POA: Diagnosis not present

## 2020-06-16 DIAGNOSIS — I2510 Atherosclerotic heart disease of native coronary artery without angina pectoris: Secondary | ICD-10-CM | POA: Diagnosis not present

## 2020-06-16 DIAGNOSIS — I340 Nonrheumatic mitral (valve) insufficiency: Secondary | ICD-10-CM | POA: Diagnosis not present

## 2020-06-16 DIAGNOSIS — Z95810 Presence of automatic (implantable) cardiac defibrillator: Secondary | ICD-10-CM | POA: Diagnosis not present

## 2020-06-16 DIAGNOSIS — I420 Dilated cardiomyopathy: Secondary | ICD-10-CM | POA: Diagnosis not present

## 2020-06-16 DIAGNOSIS — I509 Heart failure, unspecified: Secondary | ICD-10-CM | POA: Diagnosis not present

## 2020-06-20 DIAGNOSIS — J45909 Unspecified asthma, uncomplicated: Secondary | ICD-10-CM | POA: Diagnosis not present

## 2020-06-20 DIAGNOSIS — I5023 Acute on chronic systolic (congestive) heart failure: Secondary | ICD-10-CM | POA: Diagnosis not present

## 2020-06-20 DIAGNOSIS — E785 Hyperlipidemia, unspecified: Secondary | ICD-10-CM | POA: Diagnosis not present

## 2020-06-20 DIAGNOSIS — N183 Chronic kidney disease, stage 3 unspecified: Secondary | ICD-10-CM | POA: Diagnosis not present

## 2020-06-20 DIAGNOSIS — Z7951 Long term (current) use of inhaled steroids: Secondary | ICD-10-CM | POA: Diagnosis not present

## 2020-06-20 DIAGNOSIS — E1122 Type 2 diabetes mellitus with diabetic chronic kidney disease: Secondary | ICD-10-CM | POA: Diagnosis not present

## 2020-06-20 DIAGNOSIS — Z9181 History of falling: Secondary | ICD-10-CM | POA: Diagnosis not present

## 2020-06-20 DIAGNOSIS — E1151 Type 2 diabetes mellitus with diabetic peripheral angiopathy without gangrene: Secondary | ICD-10-CM | POA: Diagnosis not present

## 2020-06-20 DIAGNOSIS — Z95810 Presence of automatic (implantable) cardiac defibrillator: Secondary | ICD-10-CM | POA: Diagnosis not present

## 2020-06-20 DIAGNOSIS — Z7982 Long term (current) use of aspirin: Secondary | ICD-10-CM | POA: Diagnosis not present

## 2020-06-20 DIAGNOSIS — I82B12 Acute embolism and thrombosis of left subclavian vein: Secondary | ICD-10-CM | POA: Diagnosis not present

## 2020-06-20 DIAGNOSIS — I429 Cardiomyopathy, unspecified: Secondary | ICD-10-CM | POA: Diagnosis not present

## 2020-06-20 DIAGNOSIS — Z7984 Long term (current) use of oral hypoglycemic drugs: Secondary | ICD-10-CM | POA: Diagnosis not present

## 2020-06-20 DIAGNOSIS — K5790 Diverticulosis of intestine, part unspecified, without perforation or abscess without bleeding: Secondary | ICD-10-CM | POA: Diagnosis not present

## 2020-06-20 DIAGNOSIS — M1991 Primary osteoarthritis, unspecified site: Secondary | ICD-10-CM | POA: Diagnosis not present

## 2020-06-20 DIAGNOSIS — I130 Hypertensive heart and chronic kidney disease with heart failure and stage 1 through stage 4 chronic kidney disease, or unspecified chronic kidney disease: Secondary | ICD-10-CM | POA: Diagnosis not present

## 2020-06-20 DIAGNOSIS — Z79891 Long term (current) use of opiate analgesic: Secondary | ICD-10-CM | POA: Diagnosis not present

## 2020-06-20 DIAGNOSIS — E875 Hyperkalemia: Secondary | ICD-10-CM | POA: Diagnosis not present

## 2020-06-21 DIAGNOSIS — N183 Chronic kidney disease, stage 3 unspecified: Secondary | ICD-10-CM | POA: Diagnosis not present

## 2020-06-21 DIAGNOSIS — Z9181 History of falling: Secondary | ICD-10-CM | POA: Diagnosis not present

## 2020-06-21 DIAGNOSIS — M1991 Primary osteoarthritis, unspecified site: Secondary | ICD-10-CM | POA: Diagnosis not present

## 2020-06-21 DIAGNOSIS — Z95810 Presence of automatic (implantable) cardiac defibrillator: Secondary | ICD-10-CM | POA: Diagnosis not present

## 2020-06-21 DIAGNOSIS — K5790 Diverticulosis of intestine, part unspecified, without perforation or abscess without bleeding: Secondary | ICD-10-CM | POA: Diagnosis not present

## 2020-06-21 DIAGNOSIS — I5023 Acute on chronic systolic (congestive) heart failure: Secondary | ICD-10-CM | POA: Diagnosis not present

## 2020-06-21 DIAGNOSIS — I429 Cardiomyopathy, unspecified: Secondary | ICD-10-CM | POA: Diagnosis not present

## 2020-06-21 DIAGNOSIS — E1122 Type 2 diabetes mellitus with diabetic chronic kidney disease: Secondary | ICD-10-CM | POA: Diagnosis not present

## 2020-06-21 DIAGNOSIS — E785 Hyperlipidemia, unspecified: Secondary | ICD-10-CM | POA: Diagnosis not present

## 2020-06-21 DIAGNOSIS — I130 Hypertensive heart and chronic kidney disease with heart failure and stage 1 through stage 4 chronic kidney disease, or unspecified chronic kidney disease: Secondary | ICD-10-CM | POA: Diagnosis not present

## 2020-06-21 DIAGNOSIS — I82B12 Acute embolism and thrombosis of left subclavian vein: Secondary | ICD-10-CM | POA: Diagnosis not present

## 2020-06-21 DIAGNOSIS — Z79891 Long term (current) use of opiate analgesic: Secondary | ICD-10-CM | POA: Diagnosis not present

## 2020-06-21 DIAGNOSIS — J45909 Unspecified asthma, uncomplicated: Secondary | ICD-10-CM | POA: Diagnosis not present

## 2020-06-21 DIAGNOSIS — Z7984 Long term (current) use of oral hypoglycemic drugs: Secondary | ICD-10-CM | POA: Diagnosis not present

## 2020-06-21 DIAGNOSIS — Z7982 Long term (current) use of aspirin: Secondary | ICD-10-CM | POA: Diagnosis not present

## 2020-06-21 DIAGNOSIS — E875 Hyperkalemia: Secondary | ICD-10-CM | POA: Diagnosis not present

## 2020-06-21 DIAGNOSIS — E1151 Type 2 diabetes mellitus with diabetic peripheral angiopathy without gangrene: Secondary | ICD-10-CM | POA: Diagnosis not present

## 2020-06-21 DIAGNOSIS — Z7951 Long term (current) use of inhaled steroids: Secondary | ICD-10-CM | POA: Diagnosis not present

## 2020-06-22 DIAGNOSIS — E1122 Type 2 diabetes mellitus with diabetic chronic kidney disease: Secondary | ICD-10-CM | POA: Diagnosis not present

## 2020-06-22 DIAGNOSIS — N183 Chronic kidney disease, stage 3 unspecified: Secondary | ICD-10-CM | POA: Diagnosis not present

## 2020-06-22 DIAGNOSIS — M1991 Primary osteoarthritis, unspecified site: Secondary | ICD-10-CM | POA: Diagnosis not present

## 2020-06-22 DIAGNOSIS — Z95810 Presence of automatic (implantable) cardiac defibrillator: Secondary | ICD-10-CM | POA: Diagnosis not present

## 2020-06-22 DIAGNOSIS — Z7982 Long term (current) use of aspirin: Secondary | ICD-10-CM | POA: Diagnosis not present

## 2020-06-22 DIAGNOSIS — I5023 Acute on chronic systolic (congestive) heart failure: Secondary | ICD-10-CM | POA: Diagnosis not present

## 2020-06-22 DIAGNOSIS — Z79891 Long term (current) use of opiate analgesic: Secondary | ICD-10-CM | POA: Diagnosis not present

## 2020-06-22 DIAGNOSIS — I82B12 Acute embolism and thrombosis of left subclavian vein: Secondary | ICD-10-CM | POA: Diagnosis not present

## 2020-06-22 DIAGNOSIS — E875 Hyperkalemia: Secondary | ICD-10-CM | POA: Diagnosis not present

## 2020-06-22 DIAGNOSIS — R0902 Hypoxemia: Secondary | ICD-10-CM | POA: Diagnosis not present

## 2020-06-22 DIAGNOSIS — E785 Hyperlipidemia, unspecified: Secondary | ICD-10-CM | POA: Diagnosis not present

## 2020-06-22 DIAGNOSIS — I429 Cardiomyopathy, unspecified: Secondary | ICD-10-CM | POA: Diagnosis not present

## 2020-06-22 DIAGNOSIS — Z7951 Long term (current) use of inhaled steroids: Secondary | ICD-10-CM | POA: Diagnosis not present

## 2020-06-22 DIAGNOSIS — Z9181 History of falling: Secondary | ICD-10-CM | POA: Diagnosis not present

## 2020-06-22 DIAGNOSIS — J449 Chronic obstructive pulmonary disease, unspecified: Secondary | ICD-10-CM | POA: Diagnosis not present

## 2020-06-22 DIAGNOSIS — E1151 Type 2 diabetes mellitus with diabetic peripheral angiopathy without gangrene: Secondary | ICD-10-CM | POA: Diagnosis not present

## 2020-06-22 DIAGNOSIS — K5790 Diverticulosis of intestine, part unspecified, without perforation or abscess without bleeding: Secondary | ICD-10-CM | POA: Diagnosis not present

## 2020-06-22 DIAGNOSIS — J45909 Unspecified asthma, uncomplicated: Secondary | ICD-10-CM | POA: Diagnosis not present

## 2020-06-22 DIAGNOSIS — I130 Hypertensive heart and chronic kidney disease with heart failure and stage 1 through stage 4 chronic kidney disease, or unspecified chronic kidney disease: Secondary | ICD-10-CM | POA: Diagnosis not present

## 2020-06-22 DIAGNOSIS — Z7984 Long term (current) use of oral hypoglycemic drugs: Secondary | ICD-10-CM | POA: Diagnosis not present

## 2020-06-27 DIAGNOSIS — K5790 Diverticulosis of intestine, part unspecified, without perforation or abscess without bleeding: Secondary | ICD-10-CM | POA: Diagnosis not present

## 2020-06-27 DIAGNOSIS — E785 Hyperlipidemia, unspecified: Secondary | ICD-10-CM | POA: Diagnosis not present

## 2020-06-27 DIAGNOSIS — Z7984 Long term (current) use of oral hypoglycemic drugs: Secondary | ICD-10-CM | POA: Diagnosis not present

## 2020-06-27 DIAGNOSIS — I5023 Acute on chronic systolic (congestive) heart failure: Secondary | ICD-10-CM | POA: Diagnosis not present

## 2020-06-27 DIAGNOSIS — Z7982 Long term (current) use of aspirin: Secondary | ICD-10-CM | POA: Diagnosis not present

## 2020-06-27 DIAGNOSIS — Z9181 History of falling: Secondary | ICD-10-CM | POA: Diagnosis not present

## 2020-06-27 DIAGNOSIS — I429 Cardiomyopathy, unspecified: Secondary | ICD-10-CM | POA: Diagnosis not present

## 2020-06-27 DIAGNOSIS — J45909 Unspecified asthma, uncomplicated: Secondary | ICD-10-CM | POA: Diagnosis not present

## 2020-06-27 DIAGNOSIS — N183 Chronic kidney disease, stage 3 unspecified: Secondary | ICD-10-CM | POA: Diagnosis not present

## 2020-06-27 DIAGNOSIS — Z7951 Long term (current) use of inhaled steroids: Secondary | ICD-10-CM | POA: Diagnosis not present

## 2020-06-27 DIAGNOSIS — E1122 Type 2 diabetes mellitus with diabetic chronic kidney disease: Secondary | ICD-10-CM | POA: Diagnosis not present

## 2020-06-27 DIAGNOSIS — Z79891 Long term (current) use of opiate analgesic: Secondary | ICD-10-CM | POA: Diagnosis not present

## 2020-06-27 DIAGNOSIS — I82B12 Acute embolism and thrombosis of left subclavian vein: Secondary | ICD-10-CM | POA: Diagnosis not present

## 2020-06-27 DIAGNOSIS — I130 Hypertensive heart and chronic kidney disease with heart failure and stage 1 through stage 4 chronic kidney disease, or unspecified chronic kidney disease: Secondary | ICD-10-CM | POA: Diagnosis not present

## 2020-06-27 DIAGNOSIS — Z95810 Presence of automatic (implantable) cardiac defibrillator: Secondary | ICD-10-CM | POA: Diagnosis not present

## 2020-06-27 DIAGNOSIS — E1151 Type 2 diabetes mellitus with diabetic peripheral angiopathy without gangrene: Secondary | ICD-10-CM | POA: Diagnosis not present

## 2020-06-27 DIAGNOSIS — M1991 Primary osteoarthritis, unspecified site: Secondary | ICD-10-CM | POA: Diagnosis not present

## 2020-06-27 DIAGNOSIS — E875 Hyperkalemia: Secondary | ICD-10-CM | POA: Diagnosis not present

## 2020-06-28 DIAGNOSIS — E1122 Type 2 diabetes mellitus with diabetic chronic kidney disease: Secondary | ICD-10-CM | POA: Diagnosis not present

## 2020-06-28 DIAGNOSIS — Z7984 Long term (current) use of oral hypoglycemic drugs: Secondary | ICD-10-CM | POA: Diagnosis not present

## 2020-06-28 DIAGNOSIS — I130 Hypertensive heart and chronic kidney disease with heart failure and stage 1 through stage 4 chronic kidney disease, or unspecified chronic kidney disease: Secondary | ICD-10-CM | POA: Diagnosis not present

## 2020-06-28 DIAGNOSIS — J45909 Unspecified asthma, uncomplicated: Secondary | ICD-10-CM | POA: Diagnosis not present

## 2020-06-28 DIAGNOSIS — E875 Hyperkalemia: Secondary | ICD-10-CM | POA: Diagnosis not present

## 2020-06-28 DIAGNOSIS — E1151 Type 2 diabetes mellitus with diabetic peripheral angiopathy without gangrene: Secondary | ICD-10-CM | POA: Diagnosis not present

## 2020-06-28 DIAGNOSIS — Z79891 Long term (current) use of opiate analgesic: Secondary | ICD-10-CM | POA: Diagnosis not present

## 2020-06-28 DIAGNOSIS — E785 Hyperlipidemia, unspecified: Secondary | ICD-10-CM | POA: Diagnosis not present

## 2020-06-28 DIAGNOSIS — I5023 Acute on chronic systolic (congestive) heart failure: Secondary | ICD-10-CM | POA: Diagnosis not present

## 2020-06-28 DIAGNOSIS — Z9181 History of falling: Secondary | ICD-10-CM | POA: Diagnosis not present

## 2020-06-28 DIAGNOSIS — Z7982 Long term (current) use of aspirin: Secondary | ICD-10-CM | POA: Diagnosis not present

## 2020-06-28 DIAGNOSIS — Z7951 Long term (current) use of inhaled steroids: Secondary | ICD-10-CM | POA: Diagnosis not present

## 2020-06-28 DIAGNOSIS — I82B12 Acute embolism and thrombosis of left subclavian vein: Secondary | ICD-10-CM | POA: Diagnosis not present

## 2020-06-28 DIAGNOSIS — Z95810 Presence of automatic (implantable) cardiac defibrillator: Secondary | ICD-10-CM | POA: Diagnosis not present

## 2020-06-28 DIAGNOSIS — M1991 Primary osteoarthritis, unspecified site: Secondary | ICD-10-CM | POA: Diagnosis not present

## 2020-06-28 DIAGNOSIS — K5790 Diverticulosis of intestine, part unspecified, without perforation or abscess without bleeding: Secondary | ICD-10-CM | POA: Diagnosis not present

## 2020-06-28 DIAGNOSIS — N183 Chronic kidney disease, stage 3 unspecified: Secondary | ICD-10-CM | POA: Diagnosis not present

## 2020-06-28 DIAGNOSIS — I429 Cardiomyopathy, unspecified: Secondary | ICD-10-CM | POA: Diagnosis not present

## 2020-06-29 DIAGNOSIS — Z95810 Presence of automatic (implantable) cardiac defibrillator: Secondary | ICD-10-CM | POA: Diagnosis not present

## 2020-06-29 DIAGNOSIS — I429 Cardiomyopathy, unspecified: Secondary | ICD-10-CM | POA: Diagnosis not present

## 2020-06-29 DIAGNOSIS — E785 Hyperlipidemia, unspecified: Secondary | ICD-10-CM | POA: Diagnosis not present

## 2020-06-29 DIAGNOSIS — N183 Chronic kidney disease, stage 3 unspecified: Secondary | ICD-10-CM | POA: Diagnosis not present

## 2020-06-29 DIAGNOSIS — E1122 Type 2 diabetes mellitus with diabetic chronic kidney disease: Secondary | ICD-10-CM | POA: Diagnosis not present

## 2020-06-29 DIAGNOSIS — E1151 Type 2 diabetes mellitus with diabetic peripheral angiopathy without gangrene: Secondary | ICD-10-CM | POA: Diagnosis not present

## 2020-06-29 DIAGNOSIS — Z7982 Long term (current) use of aspirin: Secondary | ICD-10-CM | POA: Diagnosis not present

## 2020-06-29 DIAGNOSIS — Z79891 Long term (current) use of opiate analgesic: Secondary | ICD-10-CM | POA: Diagnosis not present

## 2020-06-29 DIAGNOSIS — I5023 Acute on chronic systolic (congestive) heart failure: Secondary | ICD-10-CM | POA: Diagnosis not present

## 2020-06-29 DIAGNOSIS — I130 Hypertensive heart and chronic kidney disease with heart failure and stage 1 through stage 4 chronic kidney disease, or unspecified chronic kidney disease: Secondary | ICD-10-CM | POA: Diagnosis not present

## 2020-06-29 DIAGNOSIS — Z7951 Long term (current) use of inhaled steroids: Secondary | ICD-10-CM | POA: Diagnosis not present

## 2020-06-29 DIAGNOSIS — Z9181 History of falling: Secondary | ICD-10-CM | POA: Diagnosis not present

## 2020-06-29 DIAGNOSIS — I82B12 Acute embolism and thrombosis of left subclavian vein: Secondary | ICD-10-CM | POA: Diagnosis not present

## 2020-06-29 DIAGNOSIS — Z7984 Long term (current) use of oral hypoglycemic drugs: Secondary | ICD-10-CM | POA: Diagnosis not present

## 2020-06-29 DIAGNOSIS — M1991 Primary osteoarthritis, unspecified site: Secondary | ICD-10-CM | POA: Diagnosis not present

## 2020-06-29 DIAGNOSIS — J45909 Unspecified asthma, uncomplicated: Secondary | ICD-10-CM | POA: Diagnosis not present

## 2020-06-29 DIAGNOSIS — E875 Hyperkalemia: Secondary | ICD-10-CM | POA: Diagnosis not present

## 2020-06-29 DIAGNOSIS — K5790 Diverticulosis of intestine, part unspecified, without perforation or abscess without bleeding: Secondary | ICD-10-CM | POA: Diagnosis not present

## 2020-09-09 DIAGNOSIS — I340 Nonrheumatic mitral (valve) insufficiency: Secondary | ICD-10-CM

## 2020-09-09 DIAGNOSIS — I361 Nonrheumatic tricuspid (valve) insufficiency: Secondary | ICD-10-CM

## 2020-09-11 DIAGNOSIS — I428 Other cardiomyopathies: Secondary | ICD-10-CM

## 2020-09-11 DIAGNOSIS — I2510 Atherosclerotic heart disease of native coronary artery without angina pectoris: Secondary | ICD-10-CM

## 2020-09-11 DIAGNOSIS — N189 Chronic kidney disease, unspecified: Secondary | ICD-10-CM

## 2020-09-11 DIAGNOSIS — I110 Hypertensive heart disease with heart failure: Secondary | ICD-10-CM

## 2020-09-11 DIAGNOSIS — Z95810 Presence of automatic (implantable) cardiac defibrillator: Secondary | ICD-10-CM

## 2020-09-11 DIAGNOSIS — I5022 Chronic systolic (congestive) heart failure: Secondary | ICD-10-CM

## 2020-09-11 DIAGNOSIS — I480 Paroxysmal atrial fibrillation: Secondary | ICD-10-CM

## 2020-09-11 DIAGNOSIS — I82409 Acute embolism and thrombosis of unspecified deep veins of unspecified lower extremity: Secondary | ICD-10-CM

## 2020-09-12 DIAGNOSIS — E876 Hypokalemia: Secondary | ICD-10-CM

## 2020-09-12 DIAGNOSIS — Z95810 Presence of automatic (implantable) cardiac defibrillator: Secondary | ICD-10-CM | POA: Diagnosis not present

## 2020-09-12 DIAGNOSIS — I480 Paroxysmal atrial fibrillation: Secondary | ICD-10-CM | POA: Diagnosis not present

## 2020-09-12 DIAGNOSIS — D649 Anemia, unspecified: Secondary | ICD-10-CM

## 2020-09-12 DIAGNOSIS — I110 Hypertensive heart disease with heart failure: Secondary | ICD-10-CM | POA: Diagnosis not present

## 2020-09-12 DIAGNOSIS — I2510 Atherosclerotic heart disease of native coronary artery without angina pectoris: Secondary | ICD-10-CM | POA: Diagnosis not present

## 2020-09-13 DIAGNOSIS — Z95810 Presence of automatic (implantable) cardiac defibrillator: Secondary | ICD-10-CM | POA: Diagnosis not present

## 2020-09-13 DIAGNOSIS — I480 Paroxysmal atrial fibrillation: Secondary | ICD-10-CM | POA: Diagnosis not present

## 2020-09-13 DIAGNOSIS — I110 Hypertensive heart disease with heart failure: Secondary | ICD-10-CM | POA: Diagnosis not present

## 2020-09-13 DIAGNOSIS — I2510 Atherosclerotic heart disease of native coronary artery without angina pectoris: Secondary | ICD-10-CM | POA: Diagnosis not present

## 2020-09-14 DIAGNOSIS — Z95810 Presence of automatic (implantable) cardiac defibrillator: Secondary | ICD-10-CM | POA: Diagnosis not present

## 2020-09-14 DIAGNOSIS — I2510 Atherosclerotic heart disease of native coronary artery without angina pectoris: Secondary | ICD-10-CM | POA: Diagnosis not present

## 2020-09-14 DIAGNOSIS — I110 Hypertensive heart disease with heart failure: Secondary | ICD-10-CM | POA: Diagnosis not present

## 2020-09-14 DIAGNOSIS — I480 Paroxysmal atrial fibrillation: Secondary | ICD-10-CM | POA: Diagnosis not present

## 2020-09-15 DIAGNOSIS — Z95810 Presence of automatic (implantable) cardiac defibrillator: Secondary | ICD-10-CM | POA: Diagnosis not present

## 2020-09-15 DIAGNOSIS — I110 Hypertensive heart disease with heart failure: Secondary | ICD-10-CM | POA: Diagnosis not present

## 2020-09-15 DIAGNOSIS — I480 Paroxysmal atrial fibrillation: Secondary | ICD-10-CM | POA: Diagnosis not present

## 2020-09-15 DIAGNOSIS — I2510 Atherosclerotic heart disease of native coronary artery without angina pectoris: Secondary | ICD-10-CM | POA: Diagnosis not present

## 2020-09-18 DIAGNOSIS — I4819 Other persistent atrial fibrillation: Secondary | ICD-10-CM | POA: Diagnosis not present

## 2020-09-19 DIAGNOSIS — I4819 Other persistent atrial fibrillation: Secondary | ICD-10-CM | POA: Diagnosis not present

## 2020-09-19 DIAGNOSIS — I429 Cardiomyopathy, unspecified: Secondary | ICD-10-CM

## 2020-09-19 DIAGNOSIS — Z95810 Presence of automatic (implantable) cardiac defibrillator: Secondary | ICD-10-CM

## 2020-09-19 DIAGNOSIS — I509 Heart failure, unspecified: Secondary | ICD-10-CM

## 2020-09-19 DIAGNOSIS — R1013 Epigastric pain: Secondary | ICD-10-CM | POA: Diagnosis not present

## 2020-09-20 DIAGNOSIS — I509 Heart failure, unspecified: Secondary | ICD-10-CM | POA: Diagnosis not present

## 2020-09-20 DIAGNOSIS — R1013 Epigastric pain: Secondary | ICD-10-CM | POA: Diagnosis not present

## 2020-09-20 DIAGNOSIS — I4819 Other persistent atrial fibrillation: Secondary | ICD-10-CM | POA: Diagnosis not present

## 2020-09-20 DIAGNOSIS — I429 Cardiomyopathy, unspecified: Secondary | ICD-10-CM | POA: Diagnosis not present

## 2020-09-21 DIAGNOSIS — I4819 Other persistent atrial fibrillation: Secondary | ICD-10-CM | POA: Diagnosis not present

## 2020-09-21 DIAGNOSIS — I429 Cardiomyopathy, unspecified: Secondary | ICD-10-CM | POA: Diagnosis not present

## 2020-09-21 DIAGNOSIS — R1013 Epigastric pain: Secondary | ICD-10-CM | POA: Diagnosis not present

## 2020-09-21 DIAGNOSIS — I509 Heart failure, unspecified: Secondary | ICD-10-CM | POA: Diagnosis not present

## 2020-09-22 DIAGNOSIS — I429 Cardiomyopathy, unspecified: Secondary | ICD-10-CM | POA: Diagnosis not present

## 2020-09-22 DIAGNOSIS — I509 Heart failure, unspecified: Secondary | ICD-10-CM | POA: Diagnosis not present

## 2020-09-22 DIAGNOSIS — I4819 Other persistent atrial fibrillation: Secondary | ICD-10-CM | POA: Diagnosis not present

## 2020-09-22 DIAGNOSIS — R1013 Epigastric pain: Secondary | ICD-10-CM | POA: Diagnosis not present

## 2020-10-03 DEATH — deceased
